# Patient Record
Sex: Male | Born: 1960
Health system: Southern US, Community
[De-identification: ages and names within clinical notes are randomized; demographics above are authoritative.]

## PROBLEM LIST (undated history)

## (undated) DIAGNOSIS — I2510 Atherosclerotic heart disease of native coronary artery without angina pectoris: Secondary | ICD-10-CM

## (undated) DIAGNOSIS — I4891 Unspecified atrial fibrillation: Secondary | ICD-10-CM

## (undated) DIAGNOSIS — G4730 Sleep apnea, unspecified: Secondary | ICD-10-CM

## (undated) DIAGNOSIS — F419 Anxiety disorder, unspecified: Secondary | ICD-10-CM

## (undated) DIAGNOSIS — C801 Malignant (primary) neoplasm, unspecified: Secondary | ICD-10-CM

## (undated) DIAGNOSIS — E785 Hyperlipidemia, unspecified: Secondary | ICD-10-CM

## (undated) DIAGNOSIS — I10 Essential (primary) hypertension: Secondary | ICD-10-CM

## (undated) HISTORY — DX: Hyperlipidemia, unspecified: E78.5

## (undated) HISTORY — PX: CARDIAC CATHETERIZATION: SHX172

## (undated) HISTORY — DX: Essential (primary) hypertension: I10

## (undated) HISTORY — PX: CHOLECYSTECTOMY: SHX55

## (undated) HISTORY — DX: Atherosclerotic heart disease of native coronary artery without angina pectoris: I25.10

## (undated) HISTORY — DX: Unspecified atrial fibrillation: I48.91

---

## 2005-03-22 ENCOUNTER — Ambulatory Visit: Payer: Self-pay | Admitting: Cardiology

## 2005-03-25 ENCOUNTER — Ambulatory Visit: Payer: Self-pay | Admitting: Cardiology

## 2005-03-27 ENCOUNTER — Ambulatory Visit: Payer: Self-pay | Admitting: Cardiology

## 2005-03-27 ENCOUNTER — Inpatient Hospital Stay (HOSPITAL_BASED_OUTPATIENT_CLINIC_OR_DEPARTMENT_OTHER): Admission: RE | Admit: 2005-03-27 | Discharge: 2005-03-27 | Payer: Self-pay | Admitting: Cardiology

## 2005-04-01 ENCOUNTER — Ambulatory Visit: Payer: Self-pay | Admitting: Cardiology

## 2008-06-24 HISTORY — PX: CORONARY ARTERY BYPASS GRAFT: SHX141

## 2008-08-05 ENCOUNTER — Encounter: Payer: Self-pay | Admitting: Cardiology

## 2008-08-08 ENCOUNTER — Ambulatory Visit: Payer: Self-pay | Admitting: Cardiology

## 2008-08-09 ENCOUNTER — Ambulatory Visit: Payer: Self-pay | Admitting: Thoracic Surgery (Cardiothoracic Vascular Surgery)

## 2008-08-09 ENCOUNTER — Inpatient Hospital Stay (HOSPITAL_COMMUNITY): Admission: AD | Admit: 2008-08-09 | Discharge: 2008-08-22 | Payer: Self-pay | Admitting: Cardiology

## 2008-08-10 ENCOUNTER — Encounter: Payer: Self-pay | Admitting: Thoracic Surgery (Cardiothoracic Vascular Surgery)

## 2008-08-16 ENCOUNTER — Encounter: Payer: Self-pay | Admitting: Thoracic Surgery (Cardiothoracic Vascular Surgery)

## 2008-08-18 ENCOUNTER — Encounter: Payer: Self-pay | Admitting: Cardiology

## 2008-08-20 ENCOUNTER — Encounter: Payer: Self-pay | Admitting: Cardiology

## 2008-08-24 ENCOUNTER — Ambulatory Visit: Payer: Self-pay | Admitting: Thoracic Surgery (Cardiothoracic Vascular Surgery)

## 2008-09-05 ENCOUNTER — Encounter: Payer: Self-pay | Admitting: Cardiology

## 2008-09-05 ENCOUNTER — Ambulatory Visit: Payer: Self-pay | Admitting: Thoracic Surgery (Cardiothoracic Vascular Surgery)

## 2008-09-05 ENCOUNTER — Encounter
Admission: RE | Admit: 2008-09-05 | Discharge: 2008-09-05 | Payer: Self-pay | Admitting: Thoracic Surgery (Cardiothoracic Vascular Surgery)

## 2008-09-06 ENCOUNTER — Ambulatory Visit: Payer: Self-pay | Admitting: Cardiology

## 2008-09-14 ENCOUNTER — Encounter: Payer: Self-pay | Admitting: Cardiology

## 2008-11-08 ENCOUNTER — Ambulatory Visit: Payer: Self-pay | Admitting: Cardiology

## 2008-12-01 ENCOUNTER — Encounter: Payer: Self-pay | Admitting: Cardiology

## 2009-02-06 DIAGNOSIS — I10 Essential (primary) hypertension: Secondary | ICD-10-CM | POA: Insufficient documentation

## 2009-02-06 DIAGNOSIS — E7800 Pure hypercholesterolemia, unspecified: Secondary | ICD-10-CM

## 2009-02-06 DIAGNOSIS — I4891 Unspecified atrial fibrillation: Secondary | ICD-10-CM | POA: Insufficient documentation

## 2009-02-06 DIAGNOSIS — I25810 Atherosclerosis of coronary artery bypass graft(s) without angina pectoris: Secondary | ICD-10-CM | POA: Insufficient documentation

## 2009-02-07 ENCOUNTER — Encounter: Payer: Self-pay | Admitting: Cardiology

## 2009-02-07 ENCOUNTER — Ambulatory Visit: Payer: Self-pay | Admitting: Cardiology

## 2009-02-07 DIAGNOSIS — F4001 Agoraphobia with panic disorder: Secondary | ICD-10-CM

## 2009-03-03 ENCOUNTER — Encounter: Payer: Self-pay | Admitting: Cardiology

## 2009-03-08 ENCOUNTER — Ambulatory Visit: Payer: Self-pay | Admitting: Cardiology

## 2009-06-07 ENCOUNTER — Telehealth (INDEPENDENT_AMBULATORY_CARE_PROVIDER_SITE_OTHER): Payer: Self-pay | Admitting: *Deleted

## 2009-09-06 ENCOUNTER — Encounter: Payer: Self-pay | Admitting: Cardiology

## 2009-09-08 ENCOUNTER — Ambulatory Visit: Payer: Self-pay | Admitting: Cardiology

## 2009-09-11 ENCOUNTER — Encounter: Payer: Self-pay | Admitting: Cardiology

## 2009-09-13 ENCOUNTER — Encounter (INDEPENDENT_AMBULATORY_CARE_PROVIDER_SITE_OTHER): Payer: Self-pay | Admitting: *Deleted

## 2009-11-28 IMAGING — CR DG CHEST 2V
2 series · 2 of 2 positions shown · non-contrast
Comparison: 08/10/2008

CLINICAL DATA: Preoperative respiratory exam for CABG.
Hypertension.  Diabetes.

CHEST - 2 VIEW

[w chest pa]
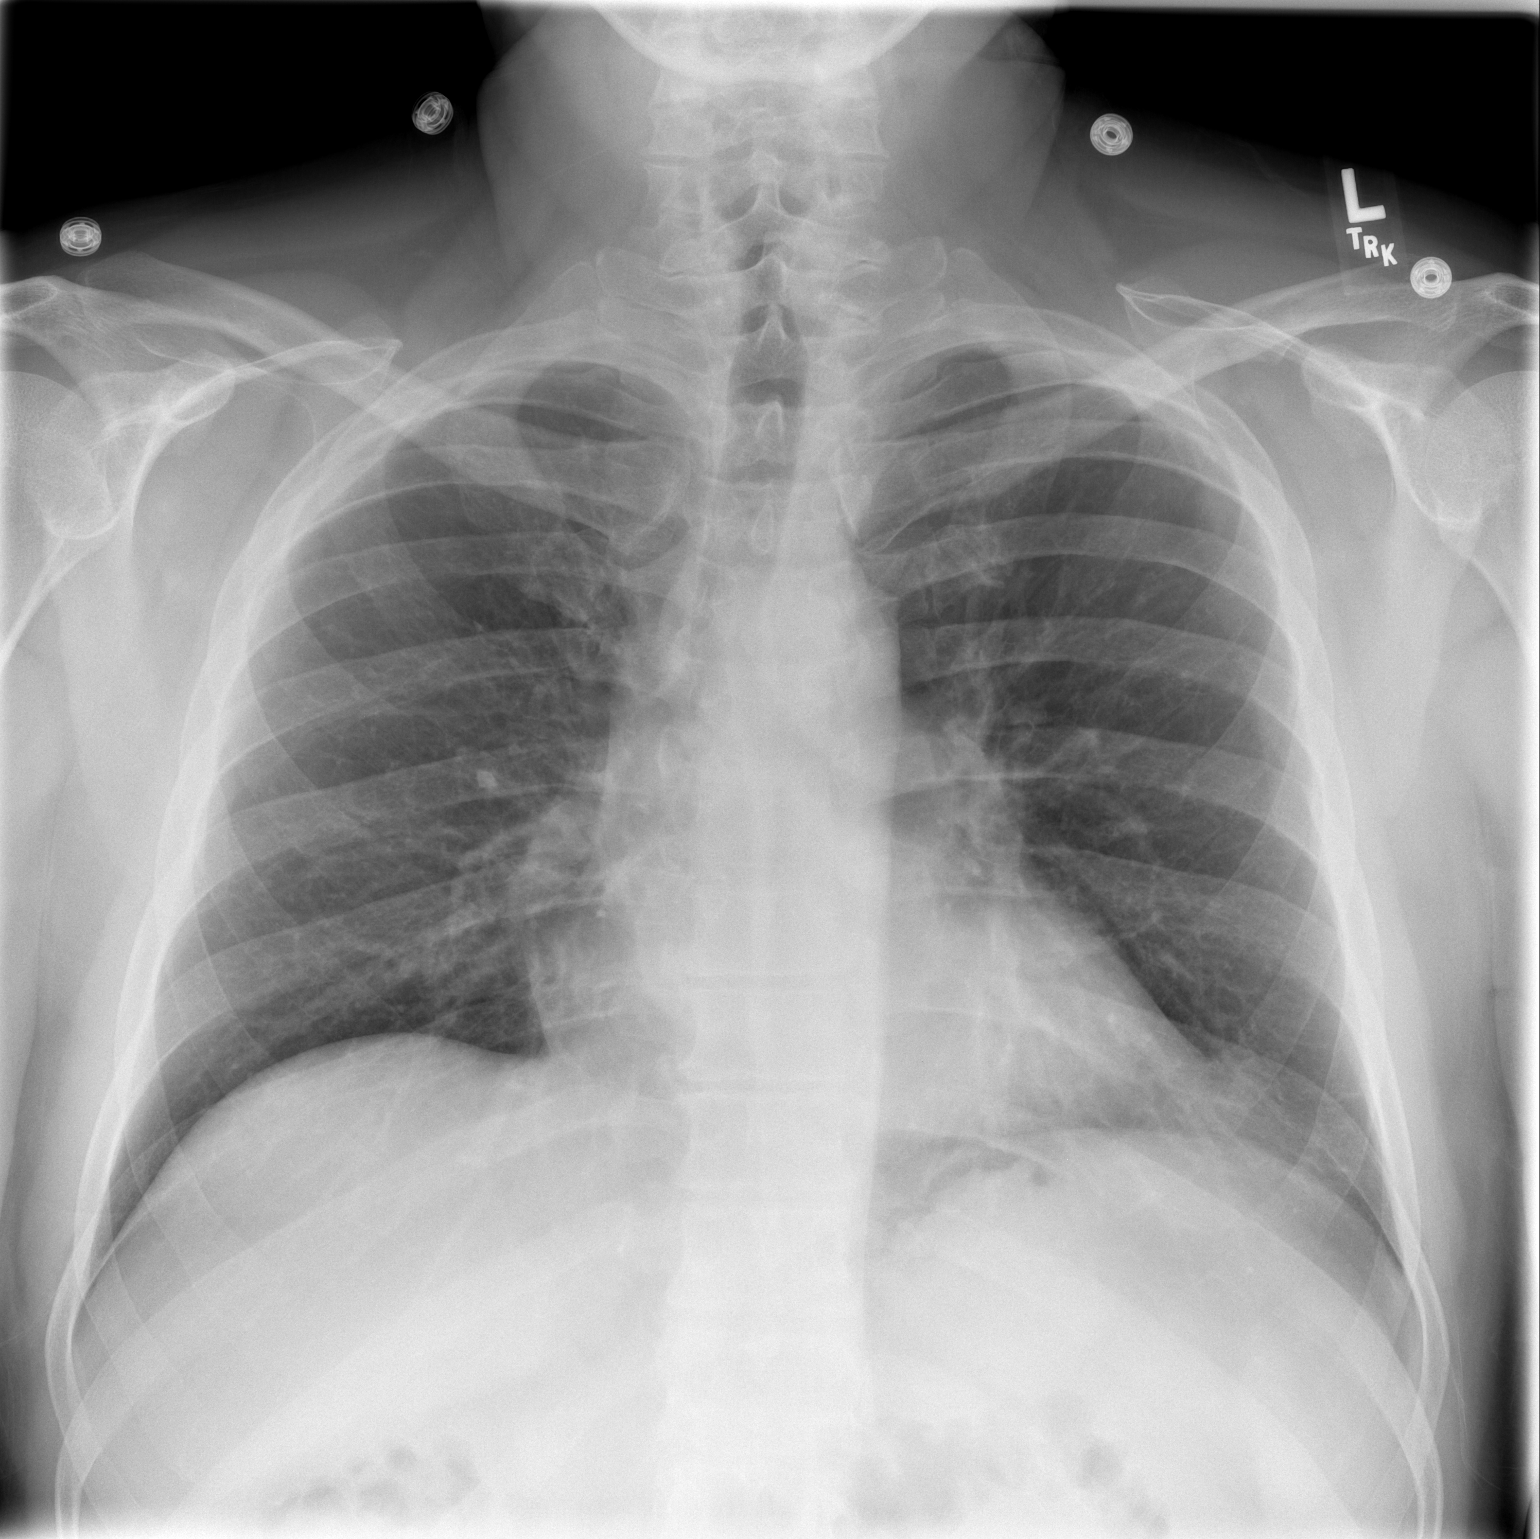

[w chest lat]
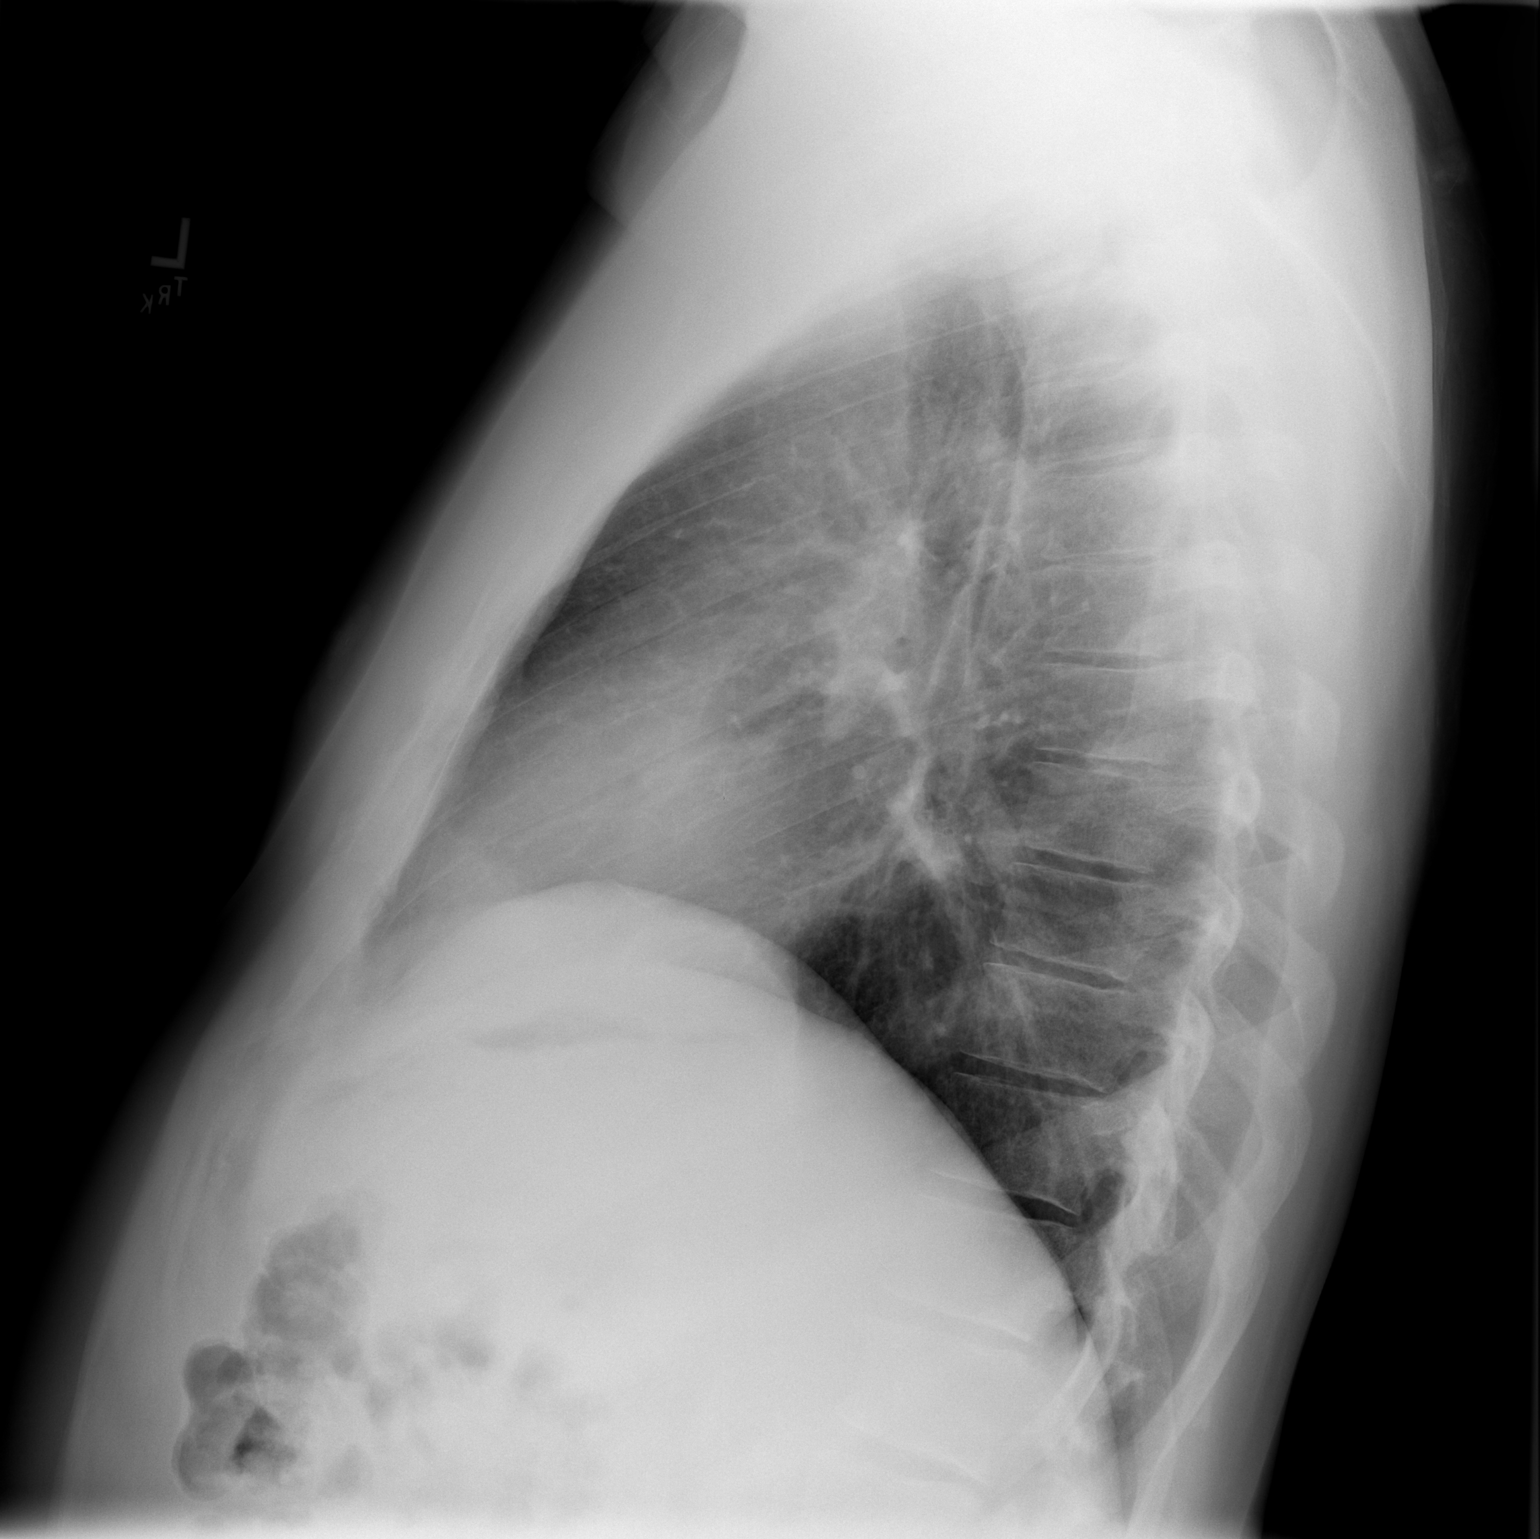

[2 of 2 positions shown; findings below may reference images not displayed]

FINDINGS: Heart size is normal.  Mediastinum is unremarkable.  The
vascularity is normal.  Lungs are clear.  No effusions.  There is
prominent epicardial fat on the left.  No significant bony finding.
IMPRESSION: Normal chest

## 2009-11-30 IMAGING — CR DG CHEST 1V PORT
1 series · 1 of 1 positions shown · non-contrast
Comparison: Portable chest [DATE]/8969 8677 hours.

CLINICAL DATA: Chest pain.  Status post CABG.

PORTABLE CHEST - 1 VIEW

[AP]
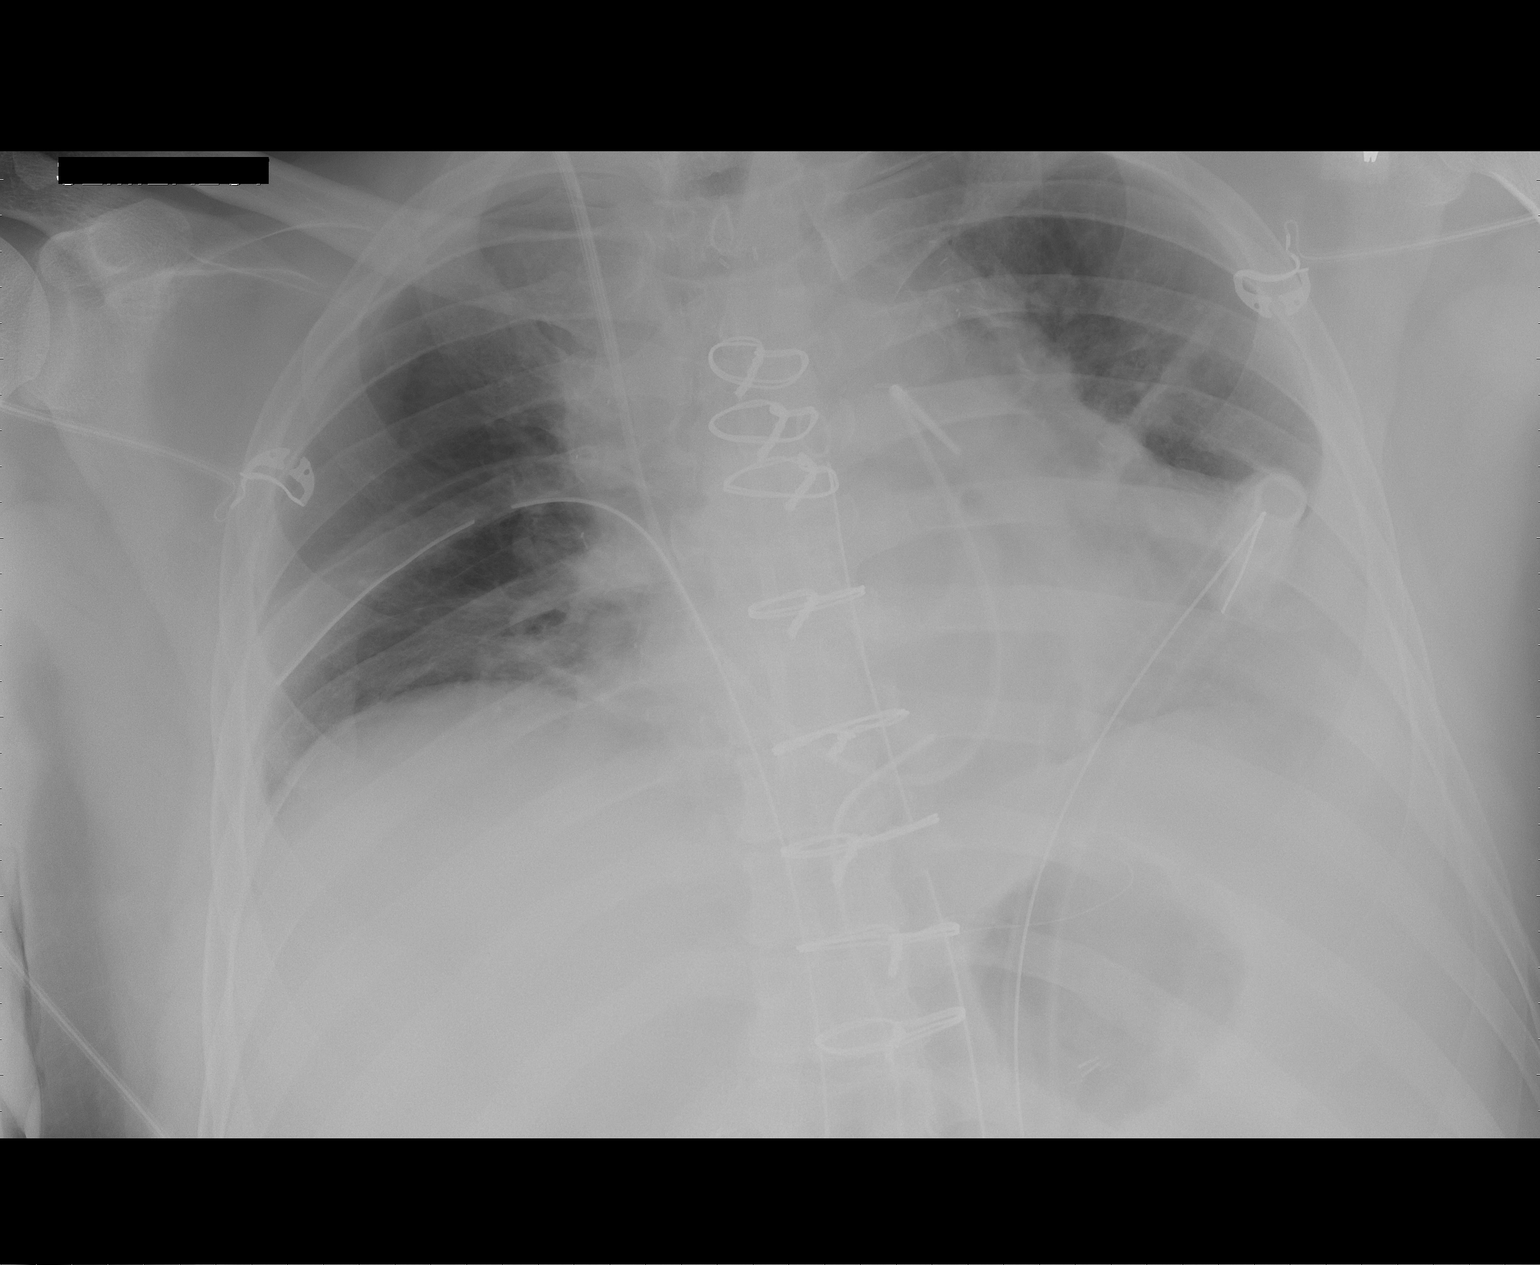

[1 of 1 positions shown; findings below may reference images not displayed]

FINDINGS: The patient's endotracheal tube and NG tube have been
removed.  Support tubes and lines including bilateral chest tubes
are otherwise unchanged. Tip of the Swan-Ganz catheter remains in
the left descending interlobar pulmonary artery.

There is no pneumothorax.  Small bilateral pleural effusions and
basilar atelectasis, greater on the left, persist.  There has been
some increase in left effusion and atelectasis.  Cardiomegaly.
IMPRESSION: 1.  Status post extubation and NG tube removal.
2.  Some increase in left basilar atelectasis and effusion.

## 2010-07-24 NOTE — Letter (Signed)
Summary: Engineer, materials at [facility]  [address]   [place], [place] [address]   Phone: [phone]  Fax: [phone]        September 13, 2009 MRN: [id number]   RALPHEAL ZAPPONE [address] [place], [place]  [address]   Dear Mr. SHOMAKER,  Your test ordered by Selena Batten has been reviewed by your physician (or physician assistant) and was found to be normal or stable. Your physician (or physician assistant) felt no changes were needed at this time.  ____ Echocardiogram  ____ Cardiac Stress Test  __X__ Lab Work  ____ Peripheral vascular study of arms, legs or neck  ____ CT scan or X-ray  ____ Lung or Breathing test  ____ Other:   Thank you.   Hoover Brunette, LPN    Duane Boston, M.D., F.A.C.C. Thressa Sheller, M.D., F.A.C.C. Oneal Grout, M.D., F.A.C.C. Cheree Ditto, M.D., F.A.C.C. Daiva Nakayama, M.D., F.A.C.C. Kenney Houseman, M.D., F.A.C.C. Jeanne Ivan, PA-C

## 2010-07-24 NOTE — Assessment & Plan Note (Signed)
Summary: 6 mo fu per feb reminder-srs   Visit Type:  Follow-up Primary Provider:  howard  CC:  follow-up visit.  History of Present Illness: the patient is a 50 year old male status post coronary bypass grafting. The patient reports no recurrent chest pain shortness of breath orthopnea PND. The patient had PTSD for a brief period of time but has discontinued citalopram and is doing much better. He denies any orthopnea PND palpitations or syncope.  Clinical Review Panels:  CXR CXR results Small (left greater than right) pleural effusions   visualized with slight ( lesser) left basilar atelectasis.  Upper   lungs are clear.  Slight cardiomegaly post CABG changes stable.  No   other new significant abnormalities seen.    IMPRESSION:    1.  Decreasing (left greater than right) small pleural effusions   with lesser left basilar atelectasis.   2.  Stable slight cardiomegaly post CABG.   3.  No new acute findings. (09/05/2008)  Cardiac Imaging Cardiac Cath Findings  1. Severe double-vessel coronary artery disease with complete       occlusion of the left anterior descending at the takeoff of a large       diagonal branch with severe stenosis of the ostium of the diagonal       branch.  Severe stenosis of the mid right coronary artery which       supplies the collaterals to the mid and distal left anterior       descending.   2. Normal left ventricular systolic function.      RECOMMENDATIONS:  This is a complex situation in a 50 year old patient   with normal LV function, hypertension and hyperlipidemia.  The right   coronary artery would be amenable to percutaneous intervention, however,   I think the patient would benefit from surgical revascularization in   regards to the complete stenosis of the mid LAD and the severe stenosis   at the ostium of the diagonal.  I will review the case with my   cardiothoracic surgery colleagues and we will consider a surgical   revascularization.   The patient was loaded with 600 mg of Plavix   yesterday and because of this there will be a delay in any surgical   revascularization.  He is currently asymptomatic without any chest pain   at rest.  The patient will be admitted to step-down intensive care unit   and started back on a heparin drip.      Verne Carrow, MD  (08/09/2008)    Preventive Screening-Counseling & Management  Alcohol-Tobacco     Smoking Status: quit     Year Quit: 1990  Current Medications (verified): 1)  Metoprolol Tartrate 25 Mg Tabs (Metoprolol Tartrate) .... Take 1 Tablet By Mouth Twice A Day 2)  Aspirin Ec 325 Mg Tbec (Aspirin) .... Take One Tablet By Mouth Daily 3)  Simvastatin 20 Mg Tabs (Simvastatin) .... Take One Tablet By Mouth Daily At Bedtime 4)  Multivitamins  Tabs (Multiple Vitamin) .... Take 1 Tablet By Mouth Once A Day  Allergies (verified): No Known Drug Allergies  Comments:  Nurse/Medical Assistant: The patient's medications and allergies were reviewed with the patient and were updated in the Medication and Allergy Lists. Bottles reviewedl  Past History:  Past Medical History: Last updated: 02/06/2009 ATRIAL FIBRILLATION (ICD-427.31) HYPERTENSION, UNSPECIFIED (ICD-401.9) HYPERCHOLESTEROLEMIA  IIA (ICD-272.0) CAD, ARTERY BYPASS GRAFT (ICD-414.04)    Past Surgical History: Last updated: 02/06/2009 CABG  Family History: Last updated: 02/06/2009 Family  History of Coronary Artery Disease:   Social History: Last updated: 02/06/2009 Full Time Married  Tobacco Use - No.  Alcohol Use - no  Risk Factors: Smoking Status: quit (09/08/2009)  Social History: Smoking Status:  quit  Review of Systems  The patient denies fatigue, malaise, fever, weight gain/loss, vision loss, decreased hearing, hoarseness, chest pain, palpitations, shortness of breath, prolonged cough, wheezing, sleep apnea, coughing up blood, abdominal pain, blood in stool, nausea, vomiting, diarrhea,  heartburn, incontinence, blood in urine, muscle weakness, joint pain, leg swelling, rash, skin lesions, headache, fainting, dizziness, depression, anxiety, enlarged lymph nodes, easy bruising or bleeding, and environmental allergies.    Vital Signs:  Patient profile:   50 year old male Height:      73 inches Weight:      224 pounds Pulse rate:   68 / minute BP sitting:   120 / 82  (left arm) Cuff size:   large  Vitals Entered By: Carlye Grippe (September 08, 2009 8:24 AM) CC: follow-up visit   Physical Exam  Additional Exam:  General: Well-developed, well-nourished in no distress head: Normocephalic and atraumatic eyes PERRLA/EOMI intact, conjunctiva and lids normal nose: No deformity or lesions mouth normal dentition, normal posterior pharynx neck: Supple, no JVD.  No masses, thyromegaly or abnormal cervical nodes lungs: Normal breath sounds bilaterally without wheezing.  Normal percussion heart: regular rate and rhythm with normal S1 and S2, no S3 or S4.  PMI is normal.  No pathological murmurs abdomen: Normal bowel sounds, abdomen is soft and nontender without masses, organomegaly or hernias noted.  No hepatosplenomegaly musculoskeletal: Back normal, normal gait muscle strength and tone normal pulsus: Pulse is normal in all 4 extremities Extremities: No peripheral pitting edema neurologic: Alert and oriented x 3 skin: Intact without lesions or rashes cervical nodes: No significant adenopathy psychologic: Normal affect    EKG  Procedure date:  09/08/2009  Findings:      sinus rhythm normal EKG heart rate 75 beats per minute.  Impression & Recommendations:  Problem # 1:  ATRIAL FIBRILLATION (ICD-427.31) resolved. EKG demonstrates normal sinus rhythm His updated medication list for this problem includes:    Metoprolol Tartrate 25 Mg Tabs (Metoprolol tartrate) .Marland Kitchen... Take 1 tablet by mouth twice a day    Aspirin Ec 325 Mg Tbec (Aspirin) .Marland Kitchen... Take one tablet by mouth  daily  Orders: EKG w/ Interpretation (93000)  Problem # 2:  HYPERCHOLESTEROLEMIA  IIA (ICD-272.0) the patient is scheduled for lipid panel and liver function tests. His updated medication list for this problem includes:    Simvastatin 20 Mg Tabs (Simvastatin) .Marland Kitchen... Take one tablet by mouth daily at bedtime  Orders: T-Lipid Profile ([address]) T-Hepatic Function [phone])  Problem # 3:  CAD, ARTERY BYPASS GRAFT (ICD-414.04) no recurrent subdural chest pain. The patient had a stress echocardiogram done in September of 2010 which was within normal limits. The patient had good exercise tolerance and was able to exercise for 13 METS His updated medication list for this problem includes:    Metoprolol Tartrate 25 Mg Tabs (Metoprolol tartrate) .Marland Kitchen... Take 1 tablet by mouth twice a day    Aspirin Ec 325 Mg Tbec (Aspirin) .Marland Kitchen... Take one tablet by mouth daily  Patient Instructions: 1)  Labs:  FLP, LFT'S  2)  Follow up in  1 year

## 2010-10-09 LAB — CBC
HCT: 30.1 % — ABNORMAL LOW (ref 39.0–52.0)
HCT: 30.4 % — ABNORMAL LOW (ref 39.0–52.0)
HCT: 44.5 % (ref 39.0–52.0)
Hemoglobin: 10.5 g/dL — ABNORMAL LOW (ref 13.0–17.0)
Hemoglobin: 10.5 g/dL — ABNORMAL LOW (ref 13.0–17.0)
Hemoglobin: 11 g/dL — ABNORMAL LOW (ref 13.0–17.0)
Hemoglobin: 11.7 g/dL — ABNORMAL LOW (ref 13.0–17.0)
Hemoglobin: 15.9 g/dL (ref 13.0–17.0)
MCHC: 35 g/dL (ref 30.0–36.0)
MCHC: 35.2 g/dL (ref 30.0–36.0)
MCHC: 35.3 g/dL (ref 30.0–36.0)
MCHC: 35.4 g/dL (ref 30.0–36.0)
MCHC: 35.4 g/dL (ref 30.0–36.0)
MCHC: 35.7 g/dL (ref 30.0–36.0)
MCHC: 35.7 g/dL (ref 30.0–36.0)
MCHC: 36.1 g/dL — ABNORMAL HIGH (ref 30.0–36.0)
MCV: 89.5 fL (ref 78.0–100.0)
MCV: 90.3 fL (ref 78.0–100.0)
MCV: 90.6 fL (ref 78.0–100.0)
MCV: 91.3 fL (ref 78.0–100.0)
MCV: 91.6 fL (ref 78.0–100.0)
MCV: 91.8 fL (ref 78.0–100.0)
MCV: 92.3 fL (ref 78.0–100.0)
Platelets: 163 10*3/uL (ref 150–400)
Platelets: 166 10*3/uL (ref 150–400)
Platelets: 169 10*3/uL (ref 150–400)
Platelets: 169 10*3/uL (ref 150–400)
Platelets: 172 10*3/uL (ref 150–400)
Platelets: 185 10*3/uL (ref 150–400)
RBC: 3.24 MIL/uL — ABNORMAL LOW (ref 4.22–5.81)
RBC: 3.4 MIL/uL — ABNORMAL LOW (ref 4.22–5.81)
RBC: 3.62 MIL/uL — ABNORMAL LOW (ref 4.22–5.81)
RBC: 4.83 MIL/uL (ref 4.22–5.81)
RBC: 4.87 MIL/uL (ref 4.22–5.81)
RDW: 12.6 % (ref 11.5–15.5)
RDW: 12.7 % (ref 11.5–15.5)
RDW: 12.8 % (ref 11.5–15.5)
RDW: 13 % (ref 11.5–15.5)
RDW: 13 % (ref 11.5–15.5)
RDW: 13.1 % (ref 11.5–15.5)
WBC: 6.2 10*3/uL (ref 4.0–10.5)
WBC: 7.1 10*3/uL (ref 4.0–10.5)
WBC: 7.5 10*3/uL (ref 4.0–10.5)
WBC: 8.3 10*3/uL (ref 4.0–10.5)
WBC: 8.8 10*3/uL (ref 4.0–10.5)

## 2010-10-09 LAB — POCT I-STAT 3, ART BLOOD GAS (G3+)
Acid-base deficit: 1 mmol/L (ref 0.0–2.0)
Acid-base deficit: 4 mmol/L — ABNORMAL HIGH (ref 0.0–2.0)
Bicarbonate: 23.7 mEq/L (ref 20.0–24.0)
O2 Saturation: 96 %
O2 Saturation: 98 %
TCO2: 21 mmol/L (ref 0–100)
TCO2: 23 mmol/L (ref 0–100)
pCO2 arterial: 35.7 mmHg (ref 35.0–45.0)
pCO2 arterial: 38.1 mmHg (ref 35.0–45.0)
pCO2 arterial: 40.7 mmHg (ref 35.0–45.0)
pH, Arterial: 7.366 (ref 7.350–7.450)
pH, Arterial: 7.374 (ref 7.350–7.450)
pO2, Arterial: 103 mmHg — ABNORMAL HIGH (ref 80.0–100.0)
pO2, Arterial: 280 mmHg — ABNORMAL HIGH (ref 80.0–100.0)
pO2, Arterial: 84 mmHg (ref 80.0–100.0)

## 2010-10-09 LAB — BASIC METABOLIC PANEL
BUN: 14 mg/dL (ref 6–23)
BUN: 15 mg/dL (ref 6–23)
BUN: 16 mg/dL (ref 6–23)
CO2: 23 mEq/L (ref 19–32)
CO2: 25 mEq/L (ref 19–32)
Calcium: 8.1 mg/dL — ABNORMAL LOW (ref 8.4–10.5)
Calcium: 9.2 mg/dL (ref 8.4–10.5)
Chloride: 110 mEq/L (ref 96–112)
Creatinine, Ser: 1.13 mg/dL (ref 0.4–1.5)
Creatinine, Ser: 1.35 mg/dL (ref 0.4–1.5)
GFR calc Af Amer: >60 mL/min (ref >60)
GFR calc Af Amer: >60 mL/min (ref >60)
GFR calc non Af Amer: 53 mL/min — ABNORMAL LOW (ref >60)
GFR calc non Af Amer: 56 mL/min — ABNORMAL LOW (ref >60)
GFR calc non Af Amer: >60 mL/min (ref >60)
GFR calc non Af Amer: >60 mL/min (ref >60)
Glucose, Bld: 111 mg/dL — ABNORMAL HIGH (ref 70–99)
Glucose, Bld: 122 mg/dL — ABNORMAL HIGH (ref 70–99)
Glucose, Bld: 95 mg/dL (ref 70–99)
Potassium: 3.7 mEq/L (ref 3.5–5.1)
Potassium: 4.3 mEq/L (ref 3.5–5.1)
Sodium: 133 mEq/L — ABNORMAL LOW (ref 135–145)
Sodium: 136 mEq/L (ref 135–145)
Sodium: 136 mEq/L (ref 135–145)
Sodium: 137 mEq/L (ref 135–145)

## 2010-10-09 LAB — HEPARIN LEVEL (UNFRACTIONATED)
Heparin Unfractionated: 0.27 IU/mL — ABNORMAL LOW (ref 0.30–0.70)
Heparin Unfractionated: 0.39 IU/mL (ref 0.30–0.70)
Heparin Unfractionated: 0.44 IU/mL (ref 0.30–0.70)
Heparin Unfractionated: 0.47 IU/mL (ref 0.30–0.70)
Heparin Unfractionated: 0.49 IU/mL (ref 0.30–0.70)

## 2010-10-09 LAB — GLUCOSE, CAPILLARY
Glucose-Capillary: 107 mg/dL — ABNORMAL HIGH (ref 70–99)
Glucose-Capillary: 109 mg/dL — ABNORMAL HIGH (ref 70–99)
Glucose-Capillary: 111 mg/dL — ABNORMAL HIGH (ref 70–99)
Glucose-Capillary: 111 mg/dL — ABNORMAL HIGH (ref 70–99)
Glucose-Capillary: 125 mg/dL — ABNORMAL HIGH (ref 70–99)
Glucose-Capillary: 57 mg/dL — ABNORMAL LOW (ref 70–99)
Glucose-Capillary: 94 mg/dL (ref 70–99)
Glucose-Capillary: 96 mg/dL (ref 70–99)

## 2010-10-09 LAB — CROSSMATCH
ABO/RH(D): A POS
Antibody Screen: NEGATIVE

## 2010-10-09 LAB — COMPREHENSIVE METABOLIC PANEL
ALT: 27 U/L (ref 0–53)
Albumin: 3.5 g/dL (ref 3.5–5.2)
Alkaline Phosphatase: 93 U/L (ref 39–117)
BUN: 11 mg/dL (ref 6–23)
CO2: 23 mEq/L (ref 19–32)
Calcium: 8.8 mg/dL (ref 8.4–10.5)
Chloride: 105 mEq/L (ref 96–112)
Chloride: 106 mEq/L (ref 96–112)
Creatinine, Ser: 1.08 mg/dL (ref 0.4–1.5)
GFR calc non Af Amer: >60 mL/min (ref >60)
Glucose, Bld: 90 mg/dL (ref 70–99)
Potassium: 4.2 mEq/L (ref 3.5–5.1)
Sodium: 136 mEq/L (ref 135–145)
Total Bilirubin: 1.2 mg/dL (ref 0.3–1.2)
Total Protein: 6.2 g/dL (ref 6.0–8.3)

## 2010-10-09 LAB — POCT I-STAT 4, (NA,K, GLUC, HGB,HCT)
Glucose, Bld: 104 mg/dL — ABNORMAL HIGH (ref 70–99)
Glucose, Bld: 108 mg/dL — ABNORMAL HIGH (ref 70–99)
Glucose, Bld: 109 mg/dL — ABNORMAL HIGH (ref 70–99)
Glucose, Bld: 91 mg/dL (ref 70–99)
Glucose, Bld: 98 mg/dL (ref 70–99)
HCT: 26 % — ABNORMAL LOW (ref 39.0–52.0)
HCT: 29 % — ABNORMAL LOW (ref 39.0–52.0)
HCT: 31 % — ABNORMAL LOW (ref 39.0–52.0)
Hemoglobin: 10.5 g/dL — ABNORMAL LOW (ref 13.0–17.0)
Hemoglobin: 8.8 g/dL — ABNORMAL LOW (ref 13.0–17.0)
Potassium: 3.9 mEq/L (ref 3.5–5.1)
Potassium: 4.1 mEq/L (ref 3.5–5.1)
Potassium: 4.3 mEq/L (ref 3.5–5.1)
Potassium: 4.7 mEq/L (ref 3.5–5.1)
Sodium: 135 mEq/L (ref 135–145)
Sodium: 138 mEq/L (ref 135–145)
Sodium: 138 mEq/L (ref 135–145)

## 2010-10-09 LAB — POCT I-STAT, CHEM 8
BUN: 12 mg/dL (ref 6–23)
Calcium, Ion: 1.2 mmol/L (ref 1.12–1.32)
Creatinine, Ser: 1.1 mg/dL (ref 0.4–1.5)
Glucose, Bld: 126 mg/dL — ABNORMAL HIGH (ref 70–99)
TCO2: 22 mmol/L (ref 0–100)

## 2010-10-09 LAB — LIPID PANEL
LDL Cholesterol: 67 mg/dL (ref 0–99)
Total CHOL/HDL Ratio: 5.9 RATIO
Triglycerides: 176 mg/dL — ABNORMAL HIGH (ref <150)
VLDL: 35 mg/dL (ref 0–40)

## 2010-10-09 LAB — HEMOGLOBIN AND HEMATOCRIT, BLOOD
HCT: 31.6 % — ABNORMAL LOW (ref 39.0–52.0)
Hemoglobin: 11.1 g/dL — ABNORMAL LOW (ref 13.0–17.0)

## 2010-10-09 LAB — POCT I-STAT GLUCOSE: Glucose, Bld: 104 mg/dL — ABNORMAL HIGH (ref 70–99)

## 2010-10-09 LAB — CARDIAC PANEL(CRET KIN+CKTOT+MB+TROPI)
CK, MB: 0.7 ng/mL (ref 0.3–4.0)
CK, MB: 0.7 ng/mL (ref 0.3–4.0)
Relative Index: INVALID (ref 0.0–2.5)
Relative Index: INVALID (ref 0.0–2.5)
Relative Index: INVALID (ref 0.0–2.5)
Total CK: 47 U/L (ref 7–232)
Total CK: 57 U/L (ref 7–232)
Troponin I: <0.01 ng/mL (ref 0.00–0.06)
Troponin I: <0.01 ng/mL (ref 0.00–0.06)
Troponin I: <0.01 ng/mL (ref 0.00–0.06)
Troponin I: <0.01 ng/mL (ref 0.00–0.06)

## 2010-10-09 LAB — PROTIME-INR
INR: 1.1 (ref 0.00–1.49)
Prothrombin Time: 14.3 seconds (ref 11.6–15.2)
Prothrombin Time: 19.6 seconds — ABNORMAL HIGH (ref 11.6–15.2)

## 2010-10-09 LAB — BLOOD GAS, ARTERIAL
Acid-base deficit: 0.2 mmol/L (ref 0.0–2.0)
TCO2: 25.1 mmol/L (ref 0–100)
pCO2 arterial: 38.7 mmHg (ref 35.0–45.0)
pO2, Arterial: 68.4 mmHg — ABNORMAL LOW (ref 80.0–100.0)

## 2010-10-09 LAB — HEMOGLOBIN A1C: Hgb A1c MFr Bld: 5.3 % (ref 4.6–6.1)

## 2010-11-06 NOTE — Assessment & Plan Note (Signed)
[facility]                          [facility] OFFICE NOTE   NAME:Robert Mack, Robert Mack                        MRN:          [id number]  DATE:09/06/2008                            DOB:          1961/01/03    Robert Mack is seen today following his cardiac surgery.  The patient was  seen at [facility] and then transferred to [facility] for cardiac  catheterization.  When he presented to [facility], we knew he had a strong  family history of coronary artery disease.  He had a stress test and it  was markedly positive and he was referred immediately for  catheterization.  The cath was done on August 09, 2008.  Decision was  then made to proceed with CABG.  This was done by Dr. Tressie Stalker.  The patient received CABG x3 with a LIMA to the LAD, RIMA to the distal  right coronary artery, and SVG to the first diagonal.  He did very well.  Postoperatively, I believe that he had atrial fibrillation.  I cannot  document that currently in the discharge summary.  However, he was sent  home on amiodarone and it can now be stopped today.  He is feeling well  in general and going about full activities.  He saw Dr. Cornelius Moras yesterday.   ALLERGIES:  No known drug allergies.   MEDICATIONS:  Aspirin, amiodarone (to be stopped today), simvastatin,  and metoprolol.   OTHER MEDICAL PROBLEMS:  See the list below.   REVIEW OF SYSTEMS:  He has no fevers or chills.  He has no GI or GU  symptoms.  He has not had any skin rashes.  His chest is healing nicely.  He has no headache or eye problems.  His review of systems otherwise is  negative.   PHYSICAL EXAMINATION:  VITAL SIGNS:  Blood pressure is 116/73 with a  pulse of 63.  Weight is 228 pounds.  GENERAL:  The patient is oriented to person, time, and place.  Affect is  normal.  The patient is here with his wife today.  HEENT:  No xanthelasma.  He has normal extraocular motion.  NECK:  There are no carotid bruits.  There is no  jugular venous  distention.  LUNGS:  Clear.  Respiratory effort is not labored.  CARDIAC:  S1 and S2.  There are no clicks or significant murmurs.  His  rhythm is quite regular.  ABDOMEN:  Soft.  EXTREMITIES:  He has no peripheral edema.   PROBLEMS:  1. Hypertension.  2. Hypercholesterolemia.  3. Coronary artery disease.  4. Status post coronary artery bypass graft on August 16, 2008, by      Dr. Cornelius Moras.  5. Postoperative arrhythmia, treated with amiodarone, it can now be      stopped.   We will stop his amiodarone.  He is to remain out of work until at least  late May 2010.  I will see him for followup.     Luis Abed, MD, [facility]  Electronically Signed    JDK/MedQ  DD: 09/06/2008  DT: 09/07/2008  Job #:  [id number] 

## 2010-11-06 NOTE — Cardiovascular Report (Signed)
Robert Mack, Robert Mack                 ACCOUNT NO.:  [id number]   MEDICAL RECORD NO.:  [id number]          PATIENT TYPE:  INP   LOCATION:  2920                         FACILITY:  [facility]   PHYSICIAN:  Verne Carrow, MDDATE OF BIRTH:  01-Oct-1960   DATE OF PROCEDURE:  08/09/2008  DATE OF DISCHARGE:                            CARDIAC CATHETERIZATION   PRIMARY CARDIOLOGIST:  Learta Codding, MD, [facility]   PROCEDURE PERFORMED:  1. Left heart catheterization.  2. Selective coronary angiography.  3. Left ventricular angiogram.  4. Placement of an Angio-Seal femoral artery closure device.   OPERATOR:  Verne Carrow, MD   INDICATION:  Chest pain in a 50 year old male with a history of  hypertension, hyperlipidemia, and a mild nonobstructive coronary artery  disease by cath in 2006 who has been having chest pain for the last 6-7  weeks associated with shortness of breath and minimal exertion.  The  patient had an outpatient stress Myoview performed at [facility]  yesterday and had significant chest pain with EKG changes during  exercise.  The test was stopped.  His stress images were significant for  significant anteroapical and inferoapical ischemia.  The patient was  transported to [facility] today for left heart catheterization.   DETAILS OF PROCEDURE:  The patient was brought to the Inpatient Cardiac  Catheterization Laboratory after signing informed consent for the  procedure.  The right groin was prepped and draped in a sterile fashion.  A 6-French sheath was inserted into the right femoral artery without  difficulty.  Standard diagnostic catheters were used to perform  selective coronary angiography.  A 6-French pigtail catheter was used to  cross the aortic valve into the left ventricle.  Following the  performance of a left ventricular angiogram, the catheter was pulled  back across the aortic valve with no significant pressure gradient  measured.  The patient  tolerated the procedure well and was taken to the  holding area in stable condition.  An Angio-Seal femoral artery closure  device was used to close the right femoral artery access site.   ANGIOGRAPHIC FINDINGS:  1. The left main coronary artery bifurcates into the circumflex, a      moderate size ramus intermedius branch and the LAD.  There is a 20%      stenosis in the midportion of the left main artery.  2. The left anterior descending has a 30% stenosis in the proximal      portion and a 100% occlusion in the midportion of the vessel at the      trifurcation of a large diagonal branch and a septal perforator.      The mid and distal LAD fill from right collaterals.  The first      diagonal has a 99% ostial stenosis.  3. The circumflex artery has no significant disease.  There is a      moderate size ramus intermedius branch that has no significant      disease.  4. The right coronary artery is a large dominant vessel that has a 95-      99%  stenosis in the midportion of the vessel.  There are luminal      irregularities in the distal right coronary artery and the      posterolateral branch.  The posterior descending artery is without      any significant disease.  The posterior descending artery supplies      collaterals to the mid and distal LAD.  5. Left ventricular angiogram was obtained in the RAO projection.      There was normal systolic function of the left ventricle with no      significant wall motion abnormalities.  The ejection fraction was      estimated at 55%.   HEMODYNAMIC DATA:  Central aortic pressure 137/83, left ventricular  pressure 132/2, end-diastolic pressure 10.   IMPRESSION:  1. Severe double-vessel coronary artery disease with complete      occlusion of the left anterior descending at the takeoff of a large      diagonal branch with severe stenosis of the ostium of the diagonal      branch.  Severe stenosis of the mid right coronary artery which       supplies the collaterals to the mid and distal left anterior      descending.  2. Normal left ventricular systolic function.   RECOMMENDATIONS:  This is a complex situation in a 50 year old patient  with normal LV function, hypertension and hyperlipidemia.  The right  coronary artery would be amenable to percutaneous intervention, however,  I think the patient would benefit from surgical revascularization in  regards to the complete stenosis of the mid LAD and the severe stenosis  at the ostium of the diagonal.  I will review the case with my  cardiothoracic surgery colleagues and we will consider a surgical  revascularization.  The patient was loaded with 600 mg of Plavix  yesterday and because of this there will be a delay in any surgical  revascularization.  He is currently asymptomatic without any chest pain  at rest.  The patient will be admitted to step-down intensive care unit  and started back on a heparin drip.      Verne Carrow, MD  Electronically Signed     CM/MEDQ  D:  08/09/2008  T:  08/10/2008  Job:  [id number]

## 2010-11-06 NOTE — Assessment & Plan Note (Signed)
OFFICE VISIT   Robert Mack, Robert Mack  DOB:  06/09/1961                                        September 05, 2008  CHART #:  [id number]   The patient returns to the office today for routine followup, status  post coronary artery bypass grafting x3 using bilateral internal mammary  arteries on August 16, 2008.  The patient's postoperative recovery has  been uneventful.  Following hospital discharge, he has continued to  improve.  Overall, he is getting along quite well.  He has not yet been  seen in followup by Dr. Myrtis Ser, but he is scheduled to see him tomorrow.  The patient has very mild residual soreness in his chest and at this  point he is only taking pain relievers at night to help him sleep.  His  activity level is quite good.  He has had no shortness of breath.  He  has no fevers or chills.  His appetite is good.  He has no complaints  and is overall getting along fine.  Medications remain unchanged from  the time of hospital discharge.   PHYSICAL EXAMINATION:  GENERAL:  A well-appearing male.  VITAL SIGNS:  Blood pressure 123/83, pulse 63, and oxygen saturation 98%  on room air.  CHEST:  A median sternotomy incision that is healing nicely.  The  sternum is stable on palpation.  LUNGS:  Breath sounds are clear to auscultation and symmetrical  bilaterally.  CARDIOVASCULAR:  Regular rate and rhythm.  No murmurs, rubs, or gallops  noted.  ABDOMEN:  Soft and nontender.  EXTREMITIES:  Warm and well perfused.  The small incision just below the  right knee from endoscopic vein harvest is also healing well.   DIAGNOSTIC TEST:  Chest x-ray obtained today at the [facility] is reviewed.  This demonstrates clear lung fields bilaterally  with trivial pleural effusions.  All the sternal wires appear intact.  No other abnormalities are noted.   IMPRESSION:  The patient is doing very well.   PLAN:  I have encouraged the patient to continue to increase  his  physical activity as tolerated with his only limitation at this point  remaining that he refrain from heavy lifting or strenuous use of his  arms or shoulders for at least another 2 months.  I have encouraged him  to get started in the cardiac rehab program.  I think he can go ahead  and resume driving an automobile at this point in time.  We have not  made any changes in his current medications.  In the future, he will  call or return to see [place] here at [facility]  only should further problems or difficulties arise.   Salvatore Decent. Cornelius Moras, M.D.  Electronically Signed   CHO/MEDQ  D:  09/05/2008  T:  09/06/2008  Job:  [id number]   cc:   Luis Abed, MD, [facility]  Verne Carrow, MD  Lorie Phenix, MD,FACC

## 2010-11-06 NOTE — Consult Note (Signed)
NAMEERSEL, ENSLIN                 ACCOUNT NO.:  [id number]   MEDICAL RECORD NO.:  [id number]          PATIENT TYPE:  INP   LOCATION:  2920                         FACILITY:  [facility]   PHYSICIAN:  Salvatore Decent. Cornelius Moras, M.D. DATE OF BIRTH:  October 12, 1960   DATE OF CONSULTATION:  08/09/2008  DATE OF DISCHARGE:                                 CONSULTATION   REQUESTING PHYSICIAN:  Verne Carrow, MD   REASON FOR CONSULTATION:  Severe 2-vessel coronary artery disease.   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  The patient states that approximately  6 weeks ago he first developed episodes of exertional substernal chest  pain consistent with angina pectoris.  He would notice that he was  walking to work on a cold morning.  He would feel substernal chest pain  described as a burning pain associated with shortness of breath.  Once  he would get to his desk and sit down to work, symptoms would resolve  within a few minutes.  He has been having episodes of similar pain  nearly every day since then.  He reports no episodes of prolonged chest  pain and his pain has always subsided within a few minutes of rest.  He  denies any episodes of pain occurring at rest or at night awaking him  from the sleep.  He has had some moderate exertional shortness of breath  that has developed over this period of time, and does not necessarily  have been associated with chest pain.  He denies resting shortness of  breath, PND, orthopnea, or lower extremity edema.  He underwent an  exercise treadmill test that was early positive.  He was admitted to  [facility] and  loaded with oral Plavix.  He was transferred to  [facility] where he underwent cardiac catheterization  this afternoon by Dr.  Clifton James.  He was found to have severe 2-vessel  coronary artery disease with preserved left ventricular function and  coronary anatomy unfavorable for percutaneous coronary intervention.  Cardiothoracic surgical consultation was requested.   REVIEW OF SYSTEMS:  GENERAL:  The patient reports normal appetite.  He  has not been gaining nor losing weight recently.  Energy level is  decreased over the last several weeks.  CARDIAC:  Notable for  accelerating symptoms of exertional chest pain consistent with angina  pectoris.  The patient reports moderate exertional shortness of breath.  RESPIRATORY:  Notable for a persistent dry nonproductive cough that is  associated with symptoms of shortness of breath and pain.  He,  otherwise, denies productive cough, hemoptysis, wheezing.  GASTROINTESTINAL:  Negative.  The patient has no difficulty swallowing.  He denies hematochezia, hematemesis, melena.  MUSCULOSKELETAL:  Negative.  The patient denies significant arthritis or arthralgias.  NEUROLOGIC:  Negative.  The patient denies symptoms suggestive of TIA or  stroke.  GENITOURINARY:  Negative.  HEENT:  Negative.  PAST MEDICAL HISTORY:  1. Hypertension.  2. Hyperlipidemia.   PAST SURGICAL HISTORY:  None.   FAMILY HISTORY:  Notable in that the patient's brother underwent  coronary artery bypass surgery in his 27s.   SOCIAL HISTORY:  The patient is married and lives with his wife.  They  have 20 year old daughter.  They live in [place].  He works for [employer] of  [place] here in [place].  He is a nonsmoker.  He denies significant  alcohol consumption.   MEDICATIONS PRIOR TO ADMISSION:  1. Toprol-XL 25 mg daily.  2. Zocor 40 mg daily.  3. Aspirin 325 mg daily.   DRUG ALLERGIES:  None known.   PHYSICAL EXAMINATION:  GENERAL:  The patient is a well-appearing male  who appears his stated age, in no acute distress.  HEENT:  Unrevealing.  NECK:  Supple.  There is no cervical or supraclavicular  lymphadenopathy.  There is no jugular venous distention.  There are no carotid bruits.  LUNGS:  Auscultation of the chest reveals clear breath sounds which are  symmetrical bilaterally.  No wheezes or rhonchi noted.  CARDIOVASCULAR:  Regular rate and rhythm.  No murmurs, rubs, or gallops  noted.  ABDOMEN:  Mildly obese but soft, nontender.  There are no palpable  masses.  Bowel sounds are present.  EXTREMITIES:  Warm and well perfused.  There is no lower extremity  edema.  Distal pulses are palpable in the posterior tibial position.  There is no sign of significant venous insufficiency.  SKIN:  Clean, dry, and healthy appearing throughout.  RECTAL AND GU:  Both deferred.   DIAGNOSTIC TEST:  Cardiac catheterization performed by Dr. Clifton James is  reviewed.  This demonstrates severe 2-vessel coronary artery disease  with preserved left ventricular function.  Specifically, there is a 100%  chronic occlusion of the mid left anterior descending coronary artery  just after takeoff of large diagonal branch.  There is subtotal  occlusion of the diagonal branch.  The left circumflex is free of  significant disease.  There is right dominant coronary circulation.  There is 95-97% stenosis of the mid right coronary artery.  There is  right-to-left collateral filling of the distal left anterior descending  coronary artery.  Left ventricular function appears preserved with  ejection fraction estimated 55%.   IMPRESSION:  Severe 2-vessel coronary artery disease with coronary  anatomy unfavorable for percutaneous coronary intervention.  Options  include coronary artery bypass grafting, possibly with bilateral  internal mammary arteries versus possibly hybrid approach with  percutaneous coronary intervention and stenting of the right coronary  artery and surgical revascularization of the left anterior descending  coronary artery and the diagonal branch.  However, I suspect that  multivessel arterial  grafting with coronary artery bypass grafting for  surgical revascularization may represent the best long-term option.   PLAN:  I have discussed matters at length with Mr. Enderson and his wife  this evening.  Alternative treatment strategies have been discussed.  They understand and accept all associated risks of surgery including but  not limited to risk of death, stroke, myocardial infarction, congestive  heart failure, respiratory failure, pneumonia, bleeding requiring blood  transfusion, arrhythmia, infection, and recurrent coronary artery  disease.  He received a loading dose  of Plavix yesterday and another dose this morning.  He remains entirely  clinically stable, and as such I suspect he would best be treated with  delayed surgical revascularization after allowing the effects of Plavix  to dissipate from his  system.      Salvatore Decent. Cornelius Moras, M.D.  Electronically Signed     CHO/MEDQ  D:  08/09/2008  T:  08/10/2008  Job:  [id number]   cc:   Verne Carrow, MD  Luis Abed, MD, [facility]  Learta Codding, MD,FACC  Selinda Flavin

## 2010-11-06 NOTE — Op Note (Signed)
NAME:  Robert Mack, Robert Mack                 ACCOUNT NO.:  [id number]   MEDICAL RECORD NO.:  [id number]          PATIENT TYPE:  INP   LOCATION:  2313                         FACILITY:  [facility]   PHYSICIAN:  Salvatore Decent. Cornelius Moras, M.D. DATE OF BIRTH:  1961-05-05   DATE OF PROCEDURE:  DATE OF DISCHARGE:                               OPERATIVE REPORT   PREOPERATIVE DIAGNOSIS:  Severe two-vessel coronary artery disease.   POSTOPERATIVE DIAGNOSIS:  Severe two-vessel coronary artery disease.   PROCEDURE:  Median sternotomy for coronary artery bypass grafting x3  (left internal mammary artery to distal left anterior descending  coronary artery, right internal mammary artery to distal right coronary  artery, saphenous vein graft to first diagonal branch, endoscopic  saphenous vein harvest from right thigh).   SURGEON:  Salvatore Decent. Cornelius Moras, MD   ASSISTANT:  Doree Fudge, PA   ANESTHESIA:  General.   BRIEF CLINICAL NOTE:  The patient is a 50 year old male who presents  with new onset angina pectoris.  He underwent a stress test that was  early positive prompting hospital admission at [facility] in  [place].  He was transferred to [facility] where cardiac  catheterization was performed.  This revealed severe two-vessel coronary  artery disease with normal left ventricular function and coronary  anatomy not favorable for percutaneous coronary intervention.  A full  consultation note has been dictated previously.   OPERATIVE FINDINGS:  1. Normal left ventricular function.  2. Good quality left and right internal mammary artery conduits for      grafting.  3. Good quality saphenous vein conduit for grafting.  4. Good quality target vessels for grafting.   OPERATIVE NOTE DETAIL:  The patient was brought to the operating room on  the above-mentioned date.  The central monitoring was established by the  Anesthesia Team under the care and direction of Dr. Adonis Huguenin.  Specifically, a Swan-Ganz catheter was placed through the right internal  jugular approach.  A radial arterial line was placed.  Intravenous  antibiotics were administered.  Following induction with general  endotracheal anesthesia, a Foley catheter was placed.  The patient's  chest, abdomen, both groins, and both lower extremities are prepared and  draped in sterile manner.   Baseline transesophageal echocardiogram was performed by Dr. Krista Blue.  This demonstrates normal left ventricular function.  No other  abnormalities are noted.   A median sternotomy incision was performed.  The left internal mammary  artery is dissected from the chest wall and prepared for bypass  grafting.  Following this, the right internal mammary artery was also  dissected from the chest wall and prepared for bypass grafting.  Both  internal mammary arteries are of good quality conduit.  Simultaneously,  saphenous vein was obtained from the patient's right thigh using  endoscopic vein harvest technique.  The saphenous vein was good quality  conduit.  After the saphenous vein has been removed, the small incision  in the right lower extremity was closed in multiple layers with running  absorbable suture.  The patient was heparinized systemically.  Both  internal  mammary arteries were transected distally and noted to have  excellent flow.   The pericardium was opened.  The ascending aorta is normal in  appearance.  The ascending aorta and the right atrium are cannulated for  cardiopulmonary bypass.  A retrograde cardioplegic catheter was placed  through the right atrium into the coronary sinus.  Cardiopulmonary  bypass was begun.  A temperature probe was placed in left ventricular  septum and a cardioplegic cannula was placed in the ascending aorta.   The patient was allowed to cool passively to 32 degrees systemic  temperature.  The aortic cross-clamp was applied and cold blood  cardioplegia was delivered  initially in an antegrade fashion through the  aortic root.  Supplemental cardioplegia was administered retrograde  through the coronary sinus catheter.  Iced saline solution was applied  for topical hypothermia.  The initial cardioplegic arrest was rapid and  excellent.  Repeat doses of cardioplegia are administered intermittently  throughout the cross-clamp portion of the operation through the aortic  root, down the subsequently placed vein graft, and retrograde through  the coronary sinus catheter to maintain left ventricular septal  temperature below 15 degrees centigrade and completely isoelectric  electrocardiogram.   The following distal coronary anastomoses were performed:  1. The distal right coronary artery is grafted with a right internal      mammary artery in an end-to-side fashion.  This vessel measured 2.2      mm in diameter and is good quality target vessel for grafting.  2. The first diagonal branch of the left anterior descending coronary      artery is grafted with a saphenous vein graft in an end-to-side      fashion.  This vessel measures 1.7 mm in diameter and is a good-      quality target vessel for grafting.  3. The distal left anterior descending coronary artery is grafted with      left internal mammary artery in an end-to-side fashion.  This      vessel measured 2.0 mm in diameter and is a good-quality target      vessel for grafting.   The single proximal saphenous vein anastomoses is performed directly to  the ascending aorta prior to removal of the aortic cross-clamp.  The  left ventricular septal temperature rises rapidly with reperfusion of  the internal mammary artery grafts.  The aortic cross-clamp was removed  after total cross-clamp time was 73 minutes.  The heart begins to beat  spontaneously without need for cardioversion.  The proximal and all  three distal coronary anastomoses are inspected for hemostasis and  appropriate graft orientation.   Epicardial pacing wires were fixed to  the right ventricular free wall into the right atrial appendage.  The  patient was rewarmed to 37 degrees centigrade temperature.  The patient  was weaned from cardiopulmonary bypass without difficulty.  The  patient's rhythm at separation from bypass is normal sinus rhythm.  No  inotropic support is required.  Total cardiopulmonary bypass time of the  operation is 99 minutes.  Followup transesophageal echocardiogram  performed by Dr. Ivin Booty after separation from bypass demonstrates normal  left ventricular function.   The venous and arterial cannulae are removed uneventfully.  Protamine  was administered to reverse the anticoagulation.  The mediastinum and  both left and right pleural spaces were irrigated with saline solution.  Meticulous surgical hemostasis was ascertained.  The mediastinum and  both left and right pleural spaces were drained with  four chest tubes,  exited through separate stab incisions inferiorly.  The pericardium and  soft tissues anterior to the aorta are reapproximated loosely.  The  sternum was closed with double-strength sternal wire.  The soft tissues  anterior to the sternum are closed in multiple layers and the skin was  closed with running subcuticular skin closure.   The patient tolerated the procedure well and he was transported to the  surgical intensive care unit in stable condition.  There are no  intraoperative complications.  All sponge, instrument, and needle counts  were verified correct at completion of the operation.  No blood products  were administered.      Salvatore Decent. Cornelius Moras, M.D.  Electronically Signed     CHO/MEDQ  D:  08/16/2008  T:  08/17/2008  Job:  [id number]   cc:   Luis Abed, MD, [facility]  Verne Carrow, MD  Learta Codding, MD,FACC  Selinda Flavin

## 2010-11-06 NOTE — Assessment & Plan Note (Signed)
[facility]                          [facility] OFF[facility] NOTE   NAME:Robert Mack, Robert Mack                        MRN:          [id number]  DATE:11/08/2008                            DOB:          11-14-1960    REFERRING PHYSICIAN:  Selinda Mack   HISTORY OF PRESENT ILLNESS:  The patient is a 50 year old male husband  of Robert Mack.  The patient underwent a recent cardiac catheterization  was found to have significant coronary artery disease and underwent  coronary artery bypass grafting with a LIMA and a RIMA graft.  The  patient has done extremely well.  He has had no shortness of breath or  chest pain.  He participated in cardiac rehab.  He has lost 30 pounds.  In the meanwhile, he has resumed his usual physical activity.  He still  states that at times he feels somewhat tired.  His main complaint  actually is difficulty sleeping since his surgery.  He denies any  symptoms consistent with anxiety or depression.   MEDICATIONS:  1. Enteric-coated aspirin 325 mg p.o. daily.  2. Simvastatin 20 mg p.o. daily.  3. Toprol 25 mg p.o. b.i.d.   PHYSICAL EXAMINATION:  VITAL SIGNS:  Blood pressure is 118/82, heart  rate 62, weight is 215 pounds.  GENERAL:  Well-nourished white male in no apparent distress.  HEENT:  Pupils are equal.  Conjunctivae clear.  NECK:  Supple, normal carotid upstroke and no carotid bruits.  LUNGS:  Clear breath sounds bilaterally.  HEART:  Regular rate and rhythm.  Normal S1 and S2.  No murmurs, rubs,  or gallops.  ABDOMEN:  Soft.  EXTREMITIES:  No cyanosis, clubbing, or edema.  SKIN:  A well-healed sternotomy scar.   PROBLEM LIST:  1. Coronary artery disease status post coronary artery bypass      grafting.  2. Hypertension.  3. Hypercholesterolemia.  4. Postoperative atrial fibrillation, resolved and off amiodarone.   PLAN:  1. The patient will need an EKG to establish a postoperative baseline.      I reviewed EKGs within  normal limits.  2. We have given the patient a prescription of trazodone for insomnia,      as this is likely      contributing to some decrease in his energy level.  I told him that      he can stop trazodone at his convenience or when he thinks that his      insomnia has resolved.     Learta Codding, MD,FACC  Electronically Signed    GED/MedQ  DD: 11/08/2008  DT: 11/09/2008  Job #: [id number]   cc:   Robert Mack

## 2010-11-06 NOTE — Discharge Summary (Signed)
NAME:  Robert Mack, Robert Mack                 ACCOUNT NO.:  [id number]   MEDICAL RECORD NO.:  [id number]           PATIENT TYPE:   LOCATION:                                 FACILITY:   PHYSICIAN:  Salvatore Decent. Cornelius Moras, M.D. DATE OF BIRTH:  14-Apr-1961   DATE OF ADMISSION:  DATE OF DISCHARGE:                               DISCHARGE SUMMARY   FINAL DIAGNOSIS:  Severe 2-vessel coronary artery disease.   IN-HOSPITAL DIAGNOSES:  1. Volume overload, postoperatively.  2. Acute blood loss anemia, postoperatively.   SECONDARY DIAGNOSES:  1. Hypertension.  2. Hyperlipidemia.   IN-HOSPITAL OPERATIONS AND PROCEDURES:  1. Cardiac catheterization on August 09, 2008, with left heart      catheterization, selective coronary angiography, left ventricular      angiogram with placement of Angio-Seal.  2. Coronary artery bypass grafting x3 using a left internal mammary      artery to the left anterior descending coronary artery, right      internal mammary artery to distal right coronary artery, saphenous      vein graft to first diagonal branch, endoscopic saphenous vein      harvest from right thigh.   THE PATIENT'S HISTORY AND PHYSICAL AND HOSPITAL COURSE:  The patient is  a 50 year old male who presents with new-onset angina pectoris.  The  patient has past medical history of hypertension and hyperlipidemia.  He  underwent stress test that was positive, prompting hospital admission to  [facility] in [place].  He was transferred to [facility]  where a cardiac catheterization was recommended and done on August 09, 2008.  This revealed severe 2-vessel coronary artery disease and normal  left ventricular function.  Following cardiac catheterization, Dr. Cornelius Moras  was consulted.  Dr. Cornelius Moras saw and evaluated the patient.  He discussed  with the patient undergoing coronary artery bypass grafting.  He  discussed risks and benefits with the patient.  The patient acknowledged  understanding and agreed  to proceed.  Surgery was scheduled for August 16, 2008.  Preoperatively, the patient did have bilateral carotid duplex  ultrasound done showing no significant ICA stenosis.  The patient  remained stable preoperatively.  For further details of the patient's  past medical history and physical exam, please see dictated H and P.   The patient was taken to the operating room on August 16, 2008, where  he underwent coronary artery bypass grafting x3 using a left internal  mammary artery to the left anterior descending coronary artery, right  internal mammary artery to distal right coronary artery, saphenous vein  graft to first diagonal branch.  Endoscopic saphenous vein harvest from  right thigh was done.  The patient tolerated this procedure well and was  transferred to the intensive care unit in stable condition.  Postoperatively, the patient was noted to be hemodynamically stable.  He  was able to be extubated on the evening of surgery.  Post extubation,  the patient was noted to be alert and oriented x4.  Neuro intact.  On  postoperative day 1, while in the intensive care unit,  all inotropic  drips were able to be weaned and discontinued.  Heart rate was noted to  be in normal sinus rhythm.  Blood pressure was stable.  A chest x-ray  done was clear.  He had minimal drainage from chest tubes, and chest  tube discontinued in normal fashion.  The patient remained  hemodynamically stable with mild acute blood loss anemia.  Cardiac rehab  got the patient out of bed and ambulated with him.  He tolerated it  well.  The patient was felt to be stable and ready for transfer to PCTU  on postop day 1.  The patient continued to progress well.  Followup  chest x-ray remained stable.  He did develop some mild volume overload  and was started on diuretics as blood pressure allows.  Daily weights  obtained and monitored.  The patient was improving from his volume  overload prior to discharge.  He  remained in normal sinus rhythm  postoperatively.  He continued to ambulate well with cardiac rehab.  He  was tolerating diet well.  No nausea or vomiting noted.  All incisions  remained clean, dry, and intact and healing well.  The patient was able  to be weaned off oxygen, sating greater than 90% on room air.   Postop day 3, August 19, 2008, vital signs showed blood pressure  stable, normal sinus rhythm.  He does have a low-grade temperature at  99.4.  Currently, we continued to wean the patient off oxygen.  He is on  2 liters at 95%, but we will continue to wean prior to discharge.  Most  recent lab work showed a sodium of 136, potassium of 4.0, chloride of  104, bicarbonate 25, BUN of 14, creatinine of 1.09, glucose of 111.  White blood cell count 10.7, hemoglobin 10.5, hematocrit 29.9, platelet  count of 109.  The patient is tentatively ready for discharge home in  the a.m. pending he remains stable and able to be weaned off oxygen.   FOLLOWUP APPOINTMENTS:  A followup appointment has been arranged with  Dr. Cornelius Moras for September 05, 2008, at 11:30 a.m.  The patient will need to  obtain PA and lateral chest x-ray 30 minutes prior to this appointment.  He will need to follow up with Dr. Vivia Ewing in 2 weeks.  He will need to  contact his office to make these arrangements.   ACTIVITY:  The patient is instructed no driving until released to do so,  no lifting over 10 pounds.  He is told to ambulate 3-4 times per day,  progress as tolerated, and continue his breathing exercises.   INCISIONAL CARE:  The patient is told to shower, washing his incisions  using soap and water.  He is to contact the office if he develops any  drainage or opening from any of his incision sites.   DIET:  The patient is educated on diet to be low fat, low salt.   DISCHARGE MEDICATIONS:  1. Zocor 20 mg at night.  2. Aspirin 325 mg daily.  3. Lopressor 25 mg b.i.d.  4. Lasix 20 mg daily x5 days.  5. Potassium  chloride 20 mEq daily.  6. Oxycodone 5 mg 1-2 tablets q.4-6 h. p.r.n.      Theda Belfast, PA      Salvatore Decent. Cornelius Moras, M.D.  Electronically Signed    KMD/MEDQ  D:  08/19/2008  T:  08/20/2008  Job:  [id number]

## 2010-11-09 NOTE — Cardiovascular Report (Signed)
NAME:  Robert Mack, SANS NO.:  [id number]   MEDICAL RECORD NO.:  [id number]          PATIENT TYPE:  OIB   LOCATION:  1961                         FACILITY:  [facility]   PHYSICIAN:  Jonelle Sidle, M.D. LHCDATE OF BIRTH:  12/31/1960   DATE OF PROCEDURE:  03/27/2005  DATE OF DISCHARGE:                              CARDIAC CATHETERIZATION   REQUESTING CARDIOLOGIST:  Dr. Willa Rough.   PRIMARY CARE PHYSICIAN:  Dr. Selinda Flavin .   INDICATIONS:  Mr. Compston is a 50 year old male with a history of  hypertension, hyperlipidemia and family history of premature cardiovascular  disease.  He  was recently admitted to [facility] with chest  pain that was felt to be most likely consistent with a gastrointestinal  etiology, although was still concerning, given his cardiac risk factors.  He  ruled out for myocardial infarction and was ultimately scheduled for an  outpatient cardiac catheterization to clearly assess the coronary anatomy.  Informed consent was obtained and signed prior to the procedure.   PROCEDURES PERFORMED:  1.  Left heart catheterization.  2.  Selective coronary angiography.  3.  Left ventriculography.   ACCESS AND EQUIPMENT:  The area about the right femoral artery was  anesthetized with 1% lidocaine and a 4-French sheath was placed in the right  femoral artery via the modified Seldinger technique.  Standard preformed 4-  [place] and JR4 catheters were used for selective coronary angiography  and an angled pigtail catheter was used for left heart catheterization and  ventriculography.  All exchanges were made over a wire and the patient  tolerated the procedure well without immediate complications.   HEMODYNAMIC RESULTS:  Aorta 123/80 mmHg.  Left ventricle 117/17 mmHg.   ANGIOGRAPHIC FINDINGS:  1.  The left main coronary artery is free of significant flow-limiting      coronary atherosclerosis.  2.  The left anterior descending is a  medium-caliber vessel with a large      bifurcating proximal diagonal branch.  There are tandem 20% and 30%      stenoses in the proximal left anterior descending as well as a 40%      ostial diagonal stenosis.  No major flow-limiting stenoses are noted.  3.  There is a large ramus intermedius branch without significant flow-      limiting coronary atherosclerosis.  4.  The circumflex coronary artery is small-to-medium in caliber with 3      obtuse marginal branches, the third of which is the largest and      bifurcates.  No significant flow-limiting coronary atherosclerosis is      noted.  5.  The right coronary artery is a large dominant vessel with a large      posterior descending and posterolateral branch.  There is 30% eccentric      stenosis in the proximal right coronary artery with no obvious thrombus      and with no major flow-limiting stenoses otherwise noted.   Left ventriculography was performed in the RAO projection and revealed an  ejection fraction of approximately 55% with  no obvious anterior apical or  inferior wall motion abnormality and trace mitral regurgitation.   DIAGNOSES:  1.  Mild coronary atherosclerosis as outlined above with no major      obstructive stenoses.  2.  Left ventricular ejection fraction of approximately 55% with a left      ventricle end-diastolic pressure of 17 mmHg and trace mitral      regurgitation.   DISCUSSION:  I reviewed the results with the patient and also went over the  films with the patient's wife and discussed them by phone with Dr. Myrtis Ser.  The results will also be relayed to Dr. Dimas Aguas through our [place] office.  At  this point, would suggest aggressive risk factor modification and close  followup.           ______________________________  Jonelle Sidle, M.D. [facility]     SGM/MEDQ  D:  03/27/2005  T:  03/27/2005  Job:  [id number]   cc:   Willa Rough, M.D.  [address]  [address]  [place]  [place] [address]   Selinda Flavin  Fax: [phone]

## 2010-11-09 NOTE — Letter (Signed)
November 28, 2008    Woodroe Chen  [address]  [place], [place] [address]   RE:  MITHCELL, SCHUMPERT  MRN:  [id number]  /  DOB:  [birth year]   Dear Ms. Testa:   I would like to inform you about the current medical condition of my  patient, Mr. Derrek Puff.  As you know this patient underwent coronary  bypass grafting and also had postoperative atrial fibrillation.  The  patient's disability forms were unfortunately filled in by one of my  partners.  He was actually not familiar with the patient's clinical  condition.  I understand that his short-term disability will be  terminated as of Nov 17, 2008.  As of this point the patient, in my  opinion, is not able to return to work yet.  The patient has still  ongoing chest pains related to his sternotomy incision as well as  significant fatigue that prevents him from doing any type of work in the  short-term.  Therefore, I feel very strongly that the patient should  continue his short-term disability until December 18, 2008 at which point he  will be reevaluated and hopefully can return back to work.   If you have any questions regarding this request, please call me  directly at my cell phone number [phone] to further discuss this.  I do anticipate the patient can return to work as of late June.  I  appreciate your attention to this issue.    Sincerely,      Learta Codding, MD,FACC  Electronically Signed   GED/MedQ  DD: 11/28/2008  DT: 11/28/2008  Job #: [id number]

## 2011-08-20 ENCOUNTER — Encounter: Payer: Self-pay | Admitting: *Deleted

## 2011-08-22 ENCOUNTER — Encounter: Payer: Self-pay | Admitting: Cardiology

## 2011-08-22 ENCOUNTER — Ambulatory Visit (INDEPENDENT_AMBULATORY_CARE_PROVIDER_SITE_OTHER): Payer: PRIVATE HEALTH INSURANCE | Admitting: Cardiology

## 2011-08-22 VITALS — BP 130/86 | HR 65 | Resp 18 | Ht 73.0 in | Wt 236.8 lb

## 2011-08-22 DIAGNOSIS — I2510 Atherosclerotic heart disease of native coronary artery without angina pectoris: Secondary | ICD-10-CM

## 2011-08-22 DIAGNOSIS — I10 Essential (primary) hypertension: Secondary | ICD-10-CM

## 2011-08-22 DIAGNOSIS — E785 Hyperlipidemia, unspecified: Secondary | ICD-10-CM

## 2011-08-22 NOTE — Patient Instructions (Signed)
Your physician recommends that you schedule a follow-up appointment in:1 year. You will receive a reminder letter in the mail 1-2 months in advance reminding you to call our office and schedule your appointment. If you don't receive this letter, please contact our office.  Your physician recommends that you continue on your current medications as directed. Please refer to the Current Medication list given to you today.   Please monitor your blood pressures at home making sure the diastolic number stays less than 85, if not please notify MD.

## 2011-08-25 ENCOUNTER — Encounter: Payer: Self-pay | Admitting: Cardiology

## 2011-08-25 DIAGNOSIS — E785 Hyperlipidemia, unspecified: Secondary | ICD-10-CM | POA: Insufficient documentation

## 2011-08-25 DIAGNOSIS — I10 Essential (primary) hypertension: Secondary | ICD-10-CM | POA: Insufficient documentation

## 2011-08-25 DIAGNOSIS — I2510 Atherosclerotic heart disease of native coronary artery without angina pectoris: Secondary | ICD-10-CM | POA: Insufficient documentation

## 2011-08-25 NOTE — Progress Notes (Signed)
Robert Bottoms, MD, [facility] ABIM Board Certified in Adult Cardiovascular Medicine,Internal Medicine and Critical Care Medicine    CC: followup patient with coronary artery disease and bypass surgery  HPI:  The patient is doing well from a cardiac perspective.  He reports no chest pain shortness of breath orthopnea or PND.  He does feel occasional palpitations which only last 10-15 min. But is otherwise asymptomatic with these.  He does have a prior history of postoperative atrial fibrillation.  Current EKG demonstrates normal sinus rhythm.  Diastolic blood pressures also mildly elevated. The patient is status post a recent pneumonia and is still complaining of some fatigue. From a cardiac standpoint is otherwise stable.   PMH: reviewed and listed in Problem List in Electronic Records (and see below) Past Medical History  Diagnosis Date  . HTN (hypertension)     borderline controlled  . HLD (hyperlipidemia)   . CAD (coronary artery disease)     status post coronary artery bypass grafting 2010 with normal LV function  . Atrial fibrillation     postoperatively   Past Surgical History  Procedure Date  . Cardiac catheterization 2006, 2010    left heart  . Coronary artery bypass graft 2010    x3    Allergies/SH/FHX : available in Electronic Records for review  No Known Allergies History   Social History  . Marital Status: Single    Spouse Name: N/A    Number of Children: N/A  . Years of Education: N/A   Occupational History  . Not on file.   Social History Main Topics  . Smoking status: Former Games developer  . Smokeless tobacco: Never Used   Comment: quit 1990  . Alcohol Use: No  . Drug Use: No  . Sexually Active: Not on file   Other Topics Concern  . Not on file   Social History Narrative  . No narrative on file   Family History  Problem Relation Age of Onset  . Heart disease      premature cardiovascular disease, FH  . Coronary artery disease       Medications: Current Outpatient Prescriptions  Medication Sig Dispense Refill  . aspirin 325 MG tablet Take 325 mg by mouth daily.      . metoprolol tartrate (LOPRESSOR) 25 MG tablet Take 25 mg by mouth 2 (two) times daily.      . simvastatin (ZOCOR) 20 MG tablet Take 20 mg by mouth every evening.        ROS: No nausea or vomiting. No fever or chills.No melena or hematochezia.No bleeding.No claudication  Physical Exam: BP 130/86  Pulse 65  Resp 18  Ht 6\' 1"  (1.854 m)  Wt 236 lb 12.8 oz (107.412 kg)  BMI 31.24 kg/m2  SpO2 98% General:well-nourished white male in no distress Neck:normal carotid upstroke and no carotid bruits Lungs:clear breath sounds bilaterally without wheezing Cardiac:regular rate and rhythm with normal S1 and S2 no murmur rubs or gallops Vascular:no edema.  Normal distal pulses Skin:warm and dry Physcologic:normal affect  12lead YNW:GNFAOZ sinus rhythm for R progression but otherwise normal tracing Limited bedside ECHO:N/A No images are attached to the encounter.    Patient Active Problem List  Diagnoses  . HYPERCHOLESTEROLEMIA  IIA  . PANIC DISORDER WITH AGORAPHOBIA-resolved  . CAD, ARTERY BYPASS GRAFT  . ATRIAL FIBRILLATION  . CAD (coronary artery disease)-no recurrent chest pain  . HLD (hyperlipidemia)  . HTN (hypertension)-borderline control    PLAN   Patient will follow up  with his blood pressure readings.  Her diastolic blood pressure remains above 85 mmHg he may need additional antihypertensive therapy.  No indication for routine ischemic testing at the present time.  The patient does report occasional palpitations and he did become more frequent further evaluation with cardiac monitor may be indicatedto make sure he does not have recurrent atrial fibrillation.

## 2011-09-12 DIAGNOSIS — R079 Chest pain, unspecified: Secondary | ICD-10-CM

## 2011-10-16 ENCOUNTER — Encounter: Payer: PRIVATE HEALTH INSURANCE | Admitting: Physician Assistant

## 2012-06-24 HISTORY — PX: CATARACT EXTRACTION: SUR2

## 2013-08-03 ENCOUNTER — Encounter: Payer: Self-pay | Admitting: Cardiovascular Disease

## 2013-08-03 ENCOUNTER — Ambulatory Visit (INDEPENDENT_AMBULATORY_CARE_PROVIDER_SITE_OTHER): Payer: Private Health Insurance - Indemnity | Admitting: Cardiovascular Disease

## 2013-08-03 VITALS — BP 148/70 | HR 66 | Ht 73.0 in | Wt 259.0 lb

## 2013-08-03 DIAGNOSIS — E785 Hyperlipidemia, unspecified: Secondary | ICD-10-CM

## 2013-08-03 DIAGNOSIS — I2510 Atherosclerotic heart disease of native coronary artery without angina pectoris: Secondary | ICD-10-CM

## 2013-08-03 DIAGNOSIS — G4730 Sleep apnea, unspecified: Secondary | ICD-10-CM

## 2013-08-03 DIAGNOSIS — R002 Palpitations: Secondary | ICD-10-CM

## 2013-08-03 DIAGNOSIS — I10 Essential (primary) hypertension: Secondary | ICD-10-CM

## 2013-08-03 DIAGNOSIS — I25810 Atherosclerosis of coronary artery bypass graft(s) without angina pectoris: Secondary | ICD-10-CM

## 2013-08-03 MED ORDER — ASPIRIN EC 81 MG PO TBEC
81.0000 mg | DELAYED_RELEASE_TABLET | Freq: Every day | ORAL | Status: DC
Start: 2013-08-03 — End: 2015-05-24

## 2013-08-03 MED ORDER — LISINOPRIL 5 MG PO TABS: 5.0000 mg | ORAL_TABLET | Freq: Every day | ORAL | Status: DC

## 2013-08-03 NOTE — Patient Instructions (Addendum)
   Decrease Aspirin to 81mg  daily   Begin Lisinopril 5mg  daily - new sent to pharm  Continue all other medications.   Your physician has recommended that you wear a 7 day event monitor. Event monitors are medical devices that record the heart's electrical activity. Doctors most often [place] these monitors to diagnose arrhythmias. Arrhythmias are problems with the speed or rhythm of the heartbeat. The monitor is a small, portable device. You can wear one while you do your normal daily activities. This is usually used to diagnose what is causing palpitations/syncope (passing out). Lab for BMET - due in 10 days (around 08/13/2013) Office will contact with results via phone or letter.    Follow up in  1 month

## 2013-08-03 NOTE — Progress Notes (Signed)
Patient ID: Robert Mack, male   DOB: 04-Apr-1961, 53 y.o.   MRN: [id number]      SUBJECTIVE: The patient is a 53 year old male with a history of coronary artery disease and CABG in 2010, hypertension, hyperlipidemia, and sleep apnea. He also has a history of postoperative atrial fibrillation. He underwent a stress echocardiogram in April 2014 which was negative for inducible ischemia. He had normal left ventricular systolic function, exercised for 7 minutes, and achieved 10 METs. He has been having palpitations which occur at least 3 or 4 times a week. He says that things are quite stressful at home as he is taking care of aging parents. 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 He says he gets very little physical activity and has put on at least 60 pounds since his bypass surgery. He thinks he is put on at least 20 pounds in the past one year itself. He leaves for work at 6 AM and returns at 5 and after taking care of his parents, he feels quite exhausted and not in the mood to exercise. His diet has not changed considerably in the past year. He does not smoke. He denies chest pain and shortness of breath. He also denies leg swelling, lightheadedness, dizziness and syncope. He sleeps with 2 pillows for comfort. He denies PND.    No Known Allergies  Current Outpatient Prescriptions  Medication Sig Dispense Refill  . aspirin 325 MG tablet Take 325 mg by mouth daily.      . metoprolol tartrate (LOPRESSOR) 25 MG tablet Take 25 mg by mouth 2 (two) times daily.      . simvastatin (ZOCOR) 20 MG tablet Take 20 mg by mouth every evening.       No current facility-administered medications for this visit.    Past Medical History  Diagnosis Date  . HTN (hypertension)     borderline controlled  . HLD (hyperlipidemia)   . CAD (coronary artery disease)     status post coronary artery bypass grafting 2010 with normal LV function  . Atrial fibrillation     postoperatively    Past  Surgical History  Procedure Laterality Date  . Cardiac catheterization  2006, 2010    left heart  . Coronary artery bypass graft  2010    x3    History   Social History  . Marital Status: Single    Spouse Name: N/A    Number of Children: N/A  . Years of Education: N/A   Occupational History  . Not on file.   Social History Main Topics  . Smoking status: Former Research scientist (life sciences)  . Smokeless tobacco: Never Used     Comment: quit 1990  . Alcohol Use: No  . Drug Use: No  . Sexual Activity: Not on file   Other Topics Concern  . Not on file   Social History Narrative  . No narrative on file     Filed Vitals:   08/03/13 1500  BP: 148/70  Pulse: 66  Height: 6\' 1"  (1.854 m)  Weight: 259 lb (117.482 kg)    PHYSICAL EXAM General: NAD Neck: No JVD, no thyromegaly or thyroid nodule.  Lungs: Clear to auscultation bilaterally with normal respiratory effort. CV: Nondisplaced PMI.  Heart regular S1/S2, no S3/S4, no murmur.  No peripheral edema.  No carotid bruit.  Normal pedal pulses.  Abdomen: Soft, nontender, no hepatosplenomegaly, no distention.  Neurologic: Alert and oriented x 3.  Psych: Normal affect. Extremities: No  clubbing or cyanosis.   ECG: reviewed and available in electronic records.      ASSESSMENT AND PLAN: 1. CAD/CABG: symptomatically stable with normal LV systolic function. Normal stress echo in 09/2012. Reduce ASA to 81 mg daily. 2. HTN: mildly elevated today. I gave him extensive counseling on the importance of regular exercise. I will start lisinopril 5 mg daily and check a BMET in 10 days to assess renal function. 3. Hyperlipidemia: lipids checked in 04/2013 by Dr. Nadara Mustard as per pt. Will obtain results. Continue simvastatin 20 mg daily. 4. Sleep apnea: uses CPAP and has noticed a significant improvement in his energy levels. 5. Palpitations: I will proceed with event monitoring for 7 days. For the time being, given his resting HR of 66 bpm on metoprolol 25 mg  bid, I will continue this present dose.  Dispo: f/u 1 month.   Kate Sable, M.D., F.A.C.C.

## 2013-08-09 ENCOUNTER — Other Ambulatory Visit: Payer: Self-pay | Admitting: *Deleted

## 2013-08-09 DIAGNOSIS — R002 Palpitations: Secondary | ICD-10-CM

## 2013-08-12 DIAGNOSIS — R002 Palpitations: Secondary | ICD-10-CM

## 2013-08-20 ENCOUNTER — Telehealth: Payer: Self-pay | Admitting: *Deleted

## 2013-08-20 NOTE — Telephone Encounter (Signed)
Message copied by Laurine Blazer on Fri Aug 20, 2013 12:07 PM ------      Message from: Kate Sable A      Created: Mon Aug 16, 2013  1:49 PM       Has CKD. Please forward results to PCP. This will need to be monitored. ------

## 2013-08-20 NOTE — Telephone Encounter (Signed)
Notes Recorded by Laurine Blazer, LPN on 3/38/3291 at 91:66 PM Patient notified. Has follow up scheduled with Dr. Bronson Ing for 09/05/2013. Stated he has never had issue with kidney function in the past. Advised to discuss further with MD at upcoming visit. Patient verbalized understanding. Copy forwarded to PMD.  Did not see diagnosis of CKD in recent dictation.

## 2013-09-01 ENCOUNTER — Other Ambulatory Visit: Payer: Self-pay | Admitting: *Deleted

## 2013-09-01 DIAGNOSIS — R002 Palpitations: Secondary | ICD-10-CM

## 2013-09-01 DIAGNOSIS — I471 Supraventricular tachycardia: Secondary | ICD-10-CM

## 2013-09-08 ENCOUNTER — Encounter: Payer: Self-pay | Admitting: Cardiovascular Disease

## 2013-09-08 ENCOUNTER — Ambulatory Visit (INDEPENDENT_AMBULATORY_CARE_PROVIDER_SITE_OTHER): Payer: Private Health Insurance - Indemnity | Admitting: Cardiovascular Disease

## 2013-09-08 VITALS — BP 145/86 | HR 73 | Ht 73.0 in | Wt 259.0 lb

## 2013-09-08 DIAGNOSIS — I10 Essential (primary) hypertension: Secondary | ICD-10-CM

## 2013-09-08 DIAGNOSIS — I471 Supraventricular tachycardia: Secondary | ICD-10-CM

## 2013-09-08 DIAGNOSIS — I25810 Atherosclerosis of coronary artery bypass graft(s) without angina pectoris: Secondary | ICD-10-CM

## 2013-09-08 DIAGNOSIS — G4730 Sleep apnea, unspecified: Secondary | ICD-10-CM

## 2013-09-08 DIAGNOSIS — R002 Palpitations: Secondary | ICD-10-CM

## 2013-09-08 DIAGNOSIS — E785 Hyperlipidemia, unspecified: Secondary | ICD-10-CM

## 2013-09-08 MED ORDER — METOPROLOL TARTRATE 50 MG PO TABS: 50.0000 mg | ORAL_TABLET | Freq: Two times a day (BID) | ORAL | Status: DC

## 2013-09-08 MED ORDER — LISINOPRIL 10 MG PO TABS: 10.0000 mg | ORAL_TABLET | Freq: Every day | ORAL | Status: DC

## 2013-09-08 NOTE — Patient Instructions (Signed)
   Increase Metoprolol tart to 50mg  twice a day    Increase Lisinopril to 10mg  daily   New meds to pharm for 90 day supply with 3 refills Continue all other medications.   Your physician wants you to follow up in: 6 months.  You will receive a reminder letter in the mail one-two months in advance.  If you don't receive a letter, please call our office to schedule the follow up appointment

## 2013-09-08 NOTE — Progress Notes (Signed)
Patient ID: Robert Mack, male   DOB: September 21, 1960, 53 y.o.   MRN: [id number]      SUBJECTIVE: The patient is a 53 year old male with a history of coronary artery disease and CABG in 2010, hypertension, hyperlipidemia, and sleep apnea. He also has a history of postoperative atrial fibrillation. He underwent a stress echocardiogram in April 2014 which was negative for inducible ischemia. He had normal left ventricular systolic function, exercised for 7 minutes, and achieved 10 METs.  He is here to follow up for palpitations, which he described at his last visit. They have been stable and have not progressed in frequency or severity. His 7 day event monitor showed a 3 second episode of supraventricular tachycardia, at which time he experienced palpitations.  BMET showed BUN 14/creatinine 1.33 with GFR 56 ml/min. Magnesium level: 2.    No Known Allergies  Current Outpatient Prescriptions  Medication Sig Dispense Refill  . aspirin EC 81 MG tablet Take 1 tablet (81 mg total) by mouth daily.      Marland Kitchen lisinopril (PRINIVIL,ZESTRIL) 5 MG tablet Take 1 tablet (5 mg total) by mouth daily.  30 tablet  6  . metoprolol tartrate (LOPRESSOR) 25 MG tablet Take 25 mg by mouth 2 (two) times daily.      . simvastatin (ZOCOR) 20 MG tablet Take 20 mg by mouth every evening.       No current facility-administered medications for this visit.    Past Medical History  Diagnosis Date  . HTN (hypertension)     borderline controlled  . HLD (hyperlipidemia)   . CAD (coronary artery disease)     status post coronary artery bypass grafting 2010 with normal LV function  . Atrial fibrillation     postoperatively    Past Surgical History  Procedure Laterality Date  . Cardiac catheterization  2006, 2010    left heart  . Coronary artery bypass graft  2010    x3    History   Social History  . Marital Status: Single    Spouse Name: N/A    Number of Children: N/A  . Years of Education: N/A   Occupational  History  . Not on file.   Social History Main Topics  . Smoking status: Former Research scientist (life sciences)  . Smokeless tobacco: Never Used     Comment: quit 1990  . Alcohol Use: No  . Drug Use: No  . Sexual Activity: Not on file   Other Topics Concern  . Not on file   Social History Narrative  . No narrative on file     Filed Vitals:   09/08/13 1452  BP: 145/86  Pulse: 73  Height: 6\' 1"  (1.854 m)  Weight: 259 lb (117.482 kg)    PHYSICAL EXAM General: NAD Neck: No JVD, no thyromegaly. Lungs: Clear to auscultation bilaterally with normal respiratory effort. CV: Nondisplaced PMI.  Regular rate and rhythm, normal S1/S2, no S3/S4, no murmur. No pretibial or periankle edema.  No carotid bruit.  Normal pedal pulses.  Abdomen: Soft, nontender, no hepatosplenomegaly, no distention.  Neurologic: Alert and oriented x 3.  Psych: Normal affect. Extremities: No clubbing or cyanosis.   ECG: reviewed and available in electronic records.      ASSESSMENT AND PLAN: 1. CAD/CABG: symptomatically stable with normal LV systolic function. Normal stress echo in 09/2012. Continue ASA and metoprolol. 2. HTN: mildly elevated again today. I will increase lisinopril to 10 mg daily. GFR 56 ml/min. 3. Hyperlipidemia: lipids checked in 04/2013 by Dr. Nadara Mustard  as per pt. Will obtain results. Continue simvastatin 20 mg daily.  4. Sleep apnea: uses CPAP and has noticed a significant improvement in his energy levels.  5. Palpitations/PSVT: I will increase metoprolol to 50 mg bid. I have asked him to inform me should he experience fatigue or any other untoward side effects.   Dispo: f/u 6 months.    Kate Sable, M.D., F.A.C.C.

## 2013-10-29 ENCOUNTER — Encounter: Payer: Self-pay | Admitting: Cardiovascular Disease

## 2014-03-17 ENCOUNTER — Ambulatory Visit (INDEPENDENT_AMBULATORY_CARE_PROVIDER_SITE_OTHER): Payer: Private Health Insurance - Indemnity | Admitting: Cardiovascular Disease

## 2014-03-17 ENCOUNTER — Encounter: Payer: Self-pay | Admitting: Cardiovascular Disease

## 2014-03-17 VITALS — BP 157/105 | HR 98 | Ht 73.0 in | Wt 255.5 lb

## 2014-03-17 DIAGNOSIS — I10 Essential (primary) hypertension: Secondary | ICD-10-CM

## 2014-03-17 DIAGNOSIS — G4730 Sleep apnea, unspecified: Secondary | ICD-10-CM

## 2014-03-17 DIAGNOSIS — I25810 Atherosclerosis of coronary artery bypass graft(s) without angina pectoris: Secondary | ICD-10-CM

## 2014-03-17 DIAGNOSIS — I471 Supraventricular tachycardia: Secondary | ICD-10-CM

## 2014-03-17 DIAGNOSIS — E785 Hyperlipidemia, unspecified: Secondary | ICD-10-CM

## 2014-03-17 DIAGNOSIS — R002 Palpitations: Secondary | ICD-10-CM

## 2014-03-17 NOTE — Patient Instructions (Signed)
There were no changes to your medications. Continue as directed. Your physician wants you to follow up in: 6 months.  You will receive a reminder letter in the mail one-two months in advance.  If you don't receive a letter, please call our office to schedule the follow up appointment.

## 2014-03-17 NOTE — Progress Notes (Signed)
Patient ID: Robert Mack, male   DOB: 1961-02-07, 53 y.o.   MRN: [id number]      SUBJECTIVE: The patient is a 53 year old male with a history of coronary artery disease and CABG in 2010, hypertension, hyperlipidemia, PSVT, and sleep apnea. He also has a history of postoperative atrial fibrillation. He underwent a stress echocardiogram in April 2014 which was negative for inducible ischemia. He had normal left ventricular systolic function, exercised for 7 minutes, and achieved 10 METs.  His mother passed away earlier this month and he has been dealing with anxiety and grief over this. He recently contracted an upper respiratory infection has been taking azithromycin as well as prednisone and a nasal decongestant. His blood pressure was 110/70 on Monday at his PCPs office. He denies chest pain and occasionally has palpitations.   Review of Systems: As per "subjective", otherwise negative.  No Known Allergies  Current Outpatient Prescriptions  Medication Sig Dispense Refill  . aspirin EC 81 MG tablet Take 1 tablet (81 mg total) by mouth daily.      Marland Kitchen lisinopril (PRINIVIL,ZESTRIL) 10 MG tablet Take 1 tablet (10 mg total) by mouth daily.  90 tablet  3  . metoprolol tartrate (LOPRESSOR) 50 MG tablet Take 1 tablet (50 mg total) by mouth 2 (two) times daily.  180 tablet  3  . simvastatin (ZOCOR) 20 MG tablet Take 20 mg by mouth every evening.       No current facility-administered medications for this visit.    Past Medical History  Diagnosis Date  . HTN (hypertension)     borderline controlled  . HLD (hyperlipidemia)   . CAD (coronary artery disease)     status post coronary artery bypass grafting 2010 with normal LV function  . Atrial fibrillation     postoperatively    Past Surgical History  Procedure Laterality Date  . Cardiac catheterization  2006, 2010    left heart  . Coronary artery bypass graft  2010    x3    History   Social History  . Marital Status: Single   Spouse Name: N/A    Number of Children: N/A  . Years of Education: N/A   Occupational History  . Not on file.   Social History Main Topics  . Smoking status: Former Smoker    Start date: 06/25/1971    Quit date: 06/24/1988  . Smokeless tobacco: Never Used     Comment: quit 1990  . Alcohol Use: Yes     Comment: Socially-beer  . Drug Use: No  . Sexual Activity: Not on file   Other Topics Concern  . Not on file   Social History Narrative  . No narrative on file     Filed Vitals:   03/17/14 1459  BP: 157/105  Pulse: 98  Height: 6\' 1"  (1.854 m)  Weight: 255 lb 8 oz (115.894 kg)  SpO2: 95%    PHYSICAL EXAM General: NAD HEENT: Normal. Neck: No JVD, no thyromegaly. Lungs: Clear to auscultation bilaterally with normal respiratory effort. CV: Nondisplaced PMI.  Regular rate and rhythm, normal S1/S2, no S3/S4, no murmur. No pretibial or periankle edema.  No carotid bruit.  Normal pedal pulses.  Abdomen: Soft, nontender, no hepatosplenomegaly, no distention.  Neurologic: Alert and oriented x 3.  Psych: Normal affect. Skin: Normal. Musculoskeletal: Normal range of motion, no gross deformities. Extremities: No clubbing or cyanosis.   ECG: Most recent ECG reviewed.      ASSESSMENT AND PLAN: 1. CAD/CABG: symptomatically  stable with normal LV systolic function. Normal stress echo in 09/2012. Continue ASA and metoprolol.  2. Essential HTN: Markedly elevated, but reportedly normal earlier this week, and may be exacerbated by steroids and nasal decongestants. I have asked the patient to check blood pressure readings 3-4 times per week, at different times throughout the day, in order to get a better approximation of mean BP values. These results will be provided to me at the end of that period so that I can determine if antihypertensive medication titration is indicated. For the time being, continue lisinopril 10 mg. 3. Hyperlipidemia: Will obtain copy of most recent lipids from  PCP's office. Continue simvastatin 20 mg daily.  4. Sleep apnea: Treated with CPAP. 5. Palpitations/PSVT: Continue metoprolol 50 mg bid.  Dispo: f/u 6 months.   Kate Sable, M.D., F.A.C.C.

## 2014-06-24 HISTORY — PX: LUMBAR FUSION: SHX111

## 2014-08-29 ENCOUNTER — Ambulatory Visit (INDEPENDENT_AMBULATORY_CARE_PROVIDER_SITE_OTHER): Payer: 59 | Admitting: Cardiovascular Disease

## 2014-08-29 ENCOUNTER — Encounter: Payer: Self-pay | Admitting: Cardiovascular Disease

## 2014-08-29 VITALS — BP 132/80 | HR 79 | Ht 73.0 in | Wt 259.0 lb

## 2014-08-29 DIAGNOSIS — G4730 Sleep apnea, unspecified: Secondary | ICD-10-CM

## 2014-08-29 DIAGNOSIS — I10 Essential (primary) hypertension: Secondary | ICD-10-CM

## 2014-08-29 DIAGNOSIS — I471 Supraventricular tachycardia, unspecified: Secondary | ICD-10-CM

## 2014-08-29 DIAGNOSIS — E785 Hyperlipidemia, unspecified: Secondary | ICD-10-CM

## 2014-08-29 DIAGNOSIS — I25810 Atherosclerosis of coronary artery bypass graft(s) without angina pectoris: Secondary | ICD-10-CM | POA: Diagnosis not present

## 2014-08-29 NOTE — Patient Instructions (Signed)
Continue all current medications. Your physician wants you to follow up in:  1 year.  You will receive a reminder letter in the mail one-two months in advance.  If you don't receive a letter, please call our office to schedule the follow up appointment   

## 2014-08-29 NOTE — Progress Notes (Addendum)
Patient ID: Robert Mack, male   DOB: 02-Apr-1961, 54 y.o.   MRN: [id number]      SUBJECTIVE: The patient presents for routine cardiovascular follow-up. He has a history of coronary artery disease and CABG in 2010, essential hypertension, hyperlipidemia, PSVT, and sleep apnea. He also has a history of postoperative atrial fibrillation. He underwent a stress echocardiogram in April 2014 which was negative for inducible ischemia. He had normal left ventricular systolic function, exercised for 7 minutes, and achieved 10 METs.  He denies chest pain, palpitations, and shortness of breath.  He injured his lumbar spine and has a bulging disc when he was lifting furniture in October 2015, and may need surgery.   ECG performed in the office today demonstrates normal sinus rhythm with no ischemic ST segment or T-wave abnormalities.  Review of Systems: As per "subjective", otherwise negative.  No Known Allergies  Current Outpatient Prescriptions  Medication Sig Dispense Refill  . aspirin EC 81 MG tablet Take 1 tablet (81 mg total) by mouth daily.    Marland Kitchen lisinopril (PRINIVIL,ZESTRIL) 10 MG tablet Take 1 tablet (10 mg total) by mouth daily. 90 tablet 3  . metoprolol tartrate (LOPRESSOR) 50 MG tablet Take 1 tablet (50 mg total) by mouth 2 (two) times daily. 180 tablet 3  . simvastatin (ZOCOR) 20 MG tablet Take 20 mg by mouth every evening.     No current facility-administered medications for this visit.    Past Medical History  Diagnosis Date  . HTN (hypertension)     borderline controlled  . HLD (hyperlipidemia)   . CAD (coronary artery disease)     status post coronary artery bypass grafting 2010 with normal LV function  . Atrial fibrillation     postoperatively    Past Surgical History  Procedure Laterality Date  . Cardiac catheterization  2006, 2010    left heart  . Coronary artery bypass graft  2010    x3    History   Social History  . Marital Status: Married    Spouse Name: N/A    . Number of Children: N/A  . Years of Education: N/A   Occupational History  . Not on file.   Social History Main Topics  . Smoking status: Former Smoker    Start date: 06/25/1971    Quit date: 06/24/1988  . Smokeless tobacco: Never Used     Comment: quit 1990  . Alcohol Use: Yes     Comment: Socially-beer  . Drug Use: No  . Sexual Activity: Not on file   Other Topics Concern  . Not on file   Social History Narrative     Filed Vitals:   08/29/14 1616  BP: 132/80  Pulse: 79  Height: 6\' 1"  (1.854 m)  Weight: 259 lb (117.482 kg)  SpO2: 97%    PHYSICAL EXAM General: NAD HEENT: Normal. Neck: No JVD, no thyromegaly. Lungs: Clear to auscultation bilaterally with normal respiratory effort. CV: Nondisplaced PMI.  Regular rate and rhythm, normal S1/S2, no S3/S4, no murmur. No pretibial or periankle edema.  No carotid bruit.  Normal pedal pulses.  Abdomen: Soft, obese, no distention.  Neurologic: Alert and oriented x 3.  Psych: Normal affect. Skin: Normal. Musculoskeletal: Normal range of motion, no gross deformities. Extremities: No clubbing or cyanosis.   ECG: Most recent ECG reviewed.      ASSESSMENT AND PLAN: 1. CAD/CABG: Symptomatically stable with normal LV systolic function. Normal stress echo in 09/2012. Continue ASA, statin, and metoprolol.  2.  Essential HTN: Well controlled on current therapy. No changes. 3. Hyperlipidemia: Will obtain copy of most recent lipids from PCP's (Dr. Nadara Mustard) office. Continue simvastatin 20 mg daily.  4. Sleep apnea: Treated with CPAP. 5. Palpitations/PSVT: Continue metoprolol 50 mg bid.  Dispo: f/u 1 year.   Kate Sable, M.D., F.A.C.C.

## 2014-08-30 ENCOUNTER — Encounter: Payer: Self-pay | Admitting: *Deleted

## 2014-09-13 ENCOUNTER — Other Ambulatory Visit: Payer: Self-pay | Admitting: Cardiovascular Disease

## 2014-09-24 ENCOUNTER — Other Ambulatory Visit: Payer: Self-pay | Admitting: Cardiovascular Disease

## 2015-05-24 ENCOUNTER — Ambulatory Visit (HOSPITAL_COMMUNITY): Payer: 59 | Admitting: Anesthesiology

## 2015-05-24 ENCOUNTER — Encounter (HOSPITAL_COMMUNITY)
Admission: AD | Disposition: A | Discharge status: Home / Self Care | Payer: Self-pay | Source: Ambulatory Visit | Attending: Internal Medicine

## 2015-05-24 ENCOUNTER — Ambulatory Visit (HOSPITAL_COMMUNITY)
Admission: AD | Admit: 2015-05-24 | Discharge: 2015-05-24 | Disposition: A | Discharge status: Home / Self Care | Payer: 59 | Source: Ambulatory Visit | Attending: Internal Medicine | Admitting: Internal Medicine

## 2015-05-24 ENCOUNTER — Encounter (HOSPITAL_COMMUNITY): Payer: Self-pay | Admitting: *Deleted

## 2015-05-24 ENCOUNTER — Ambulatory Visit (HOSPITAL_COMMUNITY): Payer: 59

## 2015-05-24 ENCOUNTER — Encounter (INDEPENDENT_AMBULATORY_CARE_PROVIDER_SITE_OTHER): Payer: Self-pay

## 2015-05-24 DIAGNOSIS — Z9049 Acquired absence of other specified parts of digestive tract: Secondary | ICD-10-CM | POA: Insufficient documentation

## 2015-05-24 DIAGNOSIS — Z87891 Personal history of nicotine dependence: Secondary | ICD-10-CM | POA: Diagnosis not present

## 2015-05-24 DIAGNOSIS — Z7982 Long term (current) use of aspirin: Secondary | ICD-10-CM | POA: Insufficient documentation

## 2015-05-24 DIAGNOSIS — R17 Unspecified jaundice: Secondary | ICD-10-CM | POA: Diagnosis not present

## 2015-05-24 DIAGNOSIS — G4730 Sleep apnea, unspecified: Secondary | ICD-10-CM | POA: Diagnosis not present

## 2015-05-24 DIAGNOSIS — K831 Obstruction of bile duct: Secondary | ICD-10-CM | POA: Insufficient documentation

## 2015-05-24 DIAGNOSIS — I2510 Atherosclerotic heart disease of native coronary artery without angina pectoris: Secondary | ICD-10-CM | POA: Diagnosis not present

## 2015-05-24 DIAGNOSIS — E785 Hyperlipidemia, unspecified: Secondary | ICD-10-CM | POA: Diagnosis not present

## 2015-05-24 DIAGNOSIS — F419 Anxiety disorder, unspecified: Secondary | ICD-10-CM | POA: Insufficient documentation

## 2015-05-24 DIAGNOSIS — Z79899 Other long term (current) drug therapy: Secondary | ICD-10-CM | POA: Insufficient documentation

## 2015-05-24 DIAGNOSIS — Z951 Presence of aortocoronary bypass graft: Secondary | ICD-10-CM | POA: Diagnosis not present

## 2015-05-24 DIAGNOSIS — K8050 Calculus of bile duct without cholangitis or cholecystitis without obstruction: Secondary | ICD-10-CM

## 2015-05-24 DIAGNOSIS — I4891 Unspecified atrial fibrillation: Secondary | ICD-10-CM | POA: Insufficient documentation

## 2015-05-24 DIAGNOSIS — K838 Other specified diseases of biliary tract: Secondary | ICD-10-CM | POA: Diagnosis not present

## 2015-05-24 DIAGNOSIS — I10 Essential (primary) hypertension: Secondary | ICD-10-CM | POA: Diagnosis not present

## 2015-05-24 DIAGNOSIS — R932 Abnormal findings on diagnostic imaging of liver and biliary tract: Secondary | ICD-10-CM | POA: Diagnosis not present

## 2015-05-24 HISTORY — DX: Anxiety disorder, unspecified: F41.9

## 2015-05-24 HISTORY — DX: Sleep apnea, unspecified: G47.30

## 2015-05-24 HISTORY — PX: BILIARY STENT PLACEMENT: SHX5538

## 2015-05-24 HISTORY — PX: ERCP: SHX5425

## 2015-05-24 HISTORY — PX: SPHINCTEROTOMY: SHX5544

## 2015-05-24 LAB — CBC
HCT: 45.4 % (ref 39.0–52.0)
Hemoglobin: 15.6 g/dL (ref 13.0–17.0)
MCH: 32.3 pg (ref 26.0–34.0)
MCHC: 34.4 g/dL (ref 30.0–36.0)
MCV: 94 fL (ref 78.0–100.0)
Platelets: 206 10*3/uL (ref 150–400)
RBC: 4.83 MIL/uL (ref 4.22–5.81)
RDW: 13.6 % (ref 11.5–15.5)
WBC: 6.8 10*3/uL (ref 4.0–10.5)

## 2015-05-24 LAB — PROTIME-INR
INR: 0.96 (ref 0.00–1.49)
Prothrombin Time: 13 seconds (ref 11.6–15.2)

## 2015-05-24 SURGERY — ERCP, WITH INTERVENTION IF INDICATED
Anesthesia: General

## 2015-05-24 MED ORDER — STERILE WATER FOR IRRIGATION IR SOLN
Status: DC | PRN
  Administered 2015-05-24: 1000 mL

## 2015-05-24 MED ORDER — HYDROCODONE-ACETAMINOPHEN 5-325 MG PO TABS: 1.0000 | ORAL_TABLET | Freq: Four times a day (QID) | ORAL | Status: DC | PRN

## 2015-05-24 MED ORDER — ONDANSETRON HCL 4 MG/2ML IJ SOLN: 4.0000 mg | Freq: Once | INTRAMUSCULAR | Status: DC | PRN

## 2015-05-24 MED ORDER — FENTANYL CITRATE (PF) 100 MCG/2ML IJ SOLN
INTRAMUSCULAR | Status: DC | PRN
  Administered 2015-05-24: 50 ug via INTRAVENOUS
  Administered 2015-05-24 (×4): 25 ug via INTRAVENOUS
  Administered 2015-05-24: 50 ug via INTRAVENOUS

## 2015-05-24 MED ORDER — BUTAMBEN-TETRACAINE-BENZOCAINE 2-2-14 % EX AERO
INHALATION_SPRAY | CUTANEOUS | Status: AC
  Administered 2015-05-24: 2
  Filled 2015-05-24: qty 20

## 2015-05-24 MED ORDER — BUTAMBEN-TETRACAINE-BENZOCAINE 2-2-14 % EX AERO
2.0000 | INHALATION_SPRAY | Freq: Once | CUTANEOUS | Status: DC
  Filled 2015-05-24: qty 20

## 2015-05-24 MED ORDER — FENTANYL CITRATE (PF) 100 MCG/2ML IJ SOLN
INTRAMUSCULAR | Status: AC
  Filled 2015-05-24: qty 2

## 2015-05-24 MED ORDER — FENTANYL CITRATE (PF) 100 MCG/2ML IJ SOLN: 25.0000 ug | INTRAMUSCULAR | Status: DC | PRN

## 2015-05-24 MED ORDER — SODIUM CHLORIDE 0.9 % IV SOLN
INTRAVENOUS | Status: DC | PRN
  Administered 2015-05-24: 100 mL

## 2015-05-24 MED ORDER — PROPOFOL 10 MG/ML IV BOLUS
INTRAVENOUS | Status: DC | PRN
  Administered 2015-05-24: 50 mg via INTRAVENOUS
  Administered 2015-05-24: 40 mg via INTRAVENOUS
  Administered 2015-05-24: 160 mg via INTRAVENOUS

## 2015-05-24 MED ORDER — NEOSTIGMINE METHYLSULFATE 10 MG/10ML IV SOLN
INTRAVENOUS | Status: DC | PRN
Start: 2015-05-24 — End: 2015-05-24
  Administered 2015-05-24: 4 mg via INTRAVENOUS

## 2015-05-24 MED ORDER — SODIUM CHLORIDE 0.9 % IV SOLN
INTRAVENOUS | Status: AC
  Filled 2015-05-24: qty 50

## 2015-05-24 MED ORDER — MIDAZOLAM HCL 2 MG/2ML IJ SOLN
1.0000 mg | INTRAMUSCULAR | Status: DC | PRN
  Administered 2015-05-24 (×2): 2 mg via INTRAVENOUS
  Filled 2015-05-24: qty 2

## 2015-05-24 MED ORDER — VITAMIN K1 10 MG/ML IJ SOLN
10.0000 mg | Freq: Once | INTRAMUSCULAR | Status: DC
  Filled 2015-05-24: qty 1

## 2015-05-24 MED ORDER — SUCCINYLCHOLINE CHLORIDE 20 MG/ML IJ SOLN
INTRAMUSCULAR | Status: DC | PRN
  Administered 2015-05-24: 140 mg via INTRAVENOUS

## 2015-05-24 MED ORDER — PHENYLEPHRINE 40 MCG/ML (10ML) SYRINGE FOR IV PUSH (FOR BLOOD PRESSURE SUPPORT)
PREFILLED_SYRINGE | INTRAVENOUS | Status: AC
  Filled 2015-05-24: qty 10

## 2015-05-24 MED ORDER — METOCLOPRAMIDE HCL 5 MG/ML IJ SOLN
10.0000 mg | Freq: Once | INTRAMUSCULAR | Status: AC
  Administered 2015-05-24: 10 mg via INTRAVENOUS
  Filled 2015-05-24: qty 2

## 2015-05-24 MED ORDER — LACTATED RINGERS IV SOLN
INTRAVENOUS | Status: DC
  Administered 2015-05-24 (×2): via INTRAVENOUS

## 2015-05-24 MED ORDER — ONDANSETRON HCL 4 MG PO TABS: 4.0000 mg | ORAL_TABLET | Freq: Three times a day (TID) | ORAL | Status: DC | PRN

## 2015-05-24 MED ORDER — LIDOCAINE HCL (CARDIAC) 20 MG/ML IV SOLN
INTRAVENOUS | Status: DC | PRN
  Administered 2015-05-24: 50 mg via INTRAVENOUS

## 2015-05-24 MED ORDER — VITAMIN K1 10 MG/ML IJ SOLN
INTRAMUSCULAR | Status: DC | PRN
  Administered 2015-05-24: 10 mg via SUBCUTANEOUS

## 2015-05-24 MED ORDER — GLUCAGON HCL RDNA (DIAGNOSTIC) 1 MG IJ SOLR
INTRAMUSCULAR | Status: AC
  Administered 2015-05-24 (×3): 0.25 mg via INTRAVENOUS
  Filled 2015-05-24: qty 2

## 2015-05-24 MED ORDER — GLYCOPYRROLATE 0.2 MG/ML IJ SOLN
INTRAMUSCULAR | Status: AC
  Filled 2015-05-24: qty 3

## 2015-05-24 MED ORDER — GLYCOPYRROLATE 0.2 MG/ML IJ SOLN
INTRAMUSCULAR | Status: DC | PRN
  Administered 2015-05-24: 0.6 mg via INTRAVENOUS

## 2015-05-24 MED ORDER — GLYCOPYRROLATE 0.2 MG/ML IJ SOLN
INTRAMUSCULAR | Status: AC
  Filled 2015-05-24: qty 1

## 2015-05-24 MED ORDER — ROCURONIUM BROMIDE 100 MG/10ML IV SOLN
INTRAVENOUS | Status: DC | PRN
  Administered 2015-05-24: 5 mg via INTRAVENOUS
  Administered 2015-05-24 (×2): 10 mg via INTRAVENOUS

## 2015-05-24 MED ORDER — MIDAZOLAM HCL 2 MG/2ML IJ SOLN
INTRAMUSCULAR | Status: AC
  Filled 2015-05-24: qty 2

## 2015-05-24 SURGICAL SUPPLY — 26 items
BAG HAMPER (MISCELLANEOUS) ×1 IMPLANT
BALLN RETRIEVAL 12X15 (BALLOONS) IMPLANT
BALLN RETRIEVAL 12X15MM (BALLOONS)
BALN RTRVL 200 6-7FR 12-15 (BALLOONS)
BASKET TRAPEZOID 3X6 (MISCELLANEOUS) IMPLANT
BSKT STON RTRVL TRAPEZOID 3X6 (MISCELLANEOUS)
DEVICE INFLATION ENCORE 26 (MISCELLANEOUS) IMPLANT
DEVICE LOCKING W-BIOPSY CAP (MISCELLANEOUS) ×1 IMPLANT
GUIDEWIRE HYDRA JAGWIRE .35 (WIRE) IMPLANT
GUIDEWIRE JAG HINI 025X260CM (WIRE) IMPLANT
KIT ENDO PROCEDURE PEN (KITS) ×1 IMPLANT
KIT ROOM TURNOVER APOR (KITS) ×1 IMPLANT
LUBRICANT JELLY 4.5OZ STERILE (MISCELLANEOUS) IMPLANT
PAD ARMBOARD 7.5X6 YLW CONV (MISCELLANEOUS) ×1 IMPLANT
PATHFINDER 450CM 0.18 (STENTS) IMPLANT
POSITIONER HEAD 8X9X4 ADT (SOFTGOODS) IMPLANT
SCOPE SPY DS DISPOSABLE (MISCELLANEOUS) ×1 IMPLANT
SNARE ROTATE MED OVAL 20MM (MISCELLANEOUS) IMPLANT
SNARE SHORT THROW 13M SML OVAL (MISCELLANEOUS) IMPLANT
SPHINCTEROTOME AUTOTOME .25 (MISCELLANEOUS) ×2 IMPLANT
SPHINCTEROTOME HYDRATOME 44 (MISCELLANEOUS) ×1 IMPLANT
SYSTEM CONTINUOUS INJECTION (MISCELLANEOUS) ×1 IMPLANT
TUBING INSUFFLATOR CO2MPACT (TUBING) ×1 IMPLANT
WALLSTENT METAL COVERED 10X60 (STENTS) IMPLANT
WALLSTENT METAL COVERED 10X80 (STENTS) IMPLANT
WATER STERILE IRR 1000ML POUR (IV SOLUTION) ×1 IMPLANT

## 2015-05-24 NOTE — Anesthesia Preprocedure Evaluation (Signed)
Anesthesia Evaluation  Patient identified by MRN, date of birth, ID band Patient awake    Reviewed: Allergy & Precautions, NPO status , Patient's Chart, lab work & pertinent test results, reviewed documented beta blocker date and time   Airway Mallampati: III  TM Distance: >3 FB     Dental  (+) Teeth Intact, Dental Advisory Given   Pulmonary sleep apnea and Continuous Positive Airway Pressure Ventilation , former smoker,    Pulmonary exam normal        Cardiovascular hypertension, Pt. on medications and Pt. on home beta blockers + CAD and + CABG  Normal cardiovascular exam+ dysrhythmias Atrial Fibrillation      Neuro/Psych Anxiety    GI/Hepatic   Endo/Other    Renal/GU Renal InsufficiencyRenal disease     Musculoskeletal   Abdominal Normal abdominal exam  (+)   Peds  Hematology   Anesthesia Other Findings   Reproductive/Obstetrics                             Anesthesia Physical Anesthesia Plan  ASA: III  Anesthesia Plan: General   Post-op Pain Management:    Induction: Intravenous and Rapid sequence  Airway Management Planned: Oral ETT  Additional Equipment:   Intra-op Plan:   Post-operative Plan: Extubation in OR  Informed Consent: I have reviewed the patients History and Physical, chart, labs and discussed the procedure including the risks, benefits and alternatives for the proposed anesthesia with the patient or authorized representative who has indicated his/her understanding and acceptance.   Dental advisory given  Plan Discussed with: CRNA  Anesthesia Plan Comments:         Anesthesia Quick Evaluation

## 2015-05-24 NOTE — Anesthesia Procedure Notes (Signed)
Procedure Name: Intubation Date/Time: 05/24/2015 3:05 PM Performed by: Andree Elk, Cimone Fahey A Pre-anesthesia Checklist: Patient identified, Patient being monitored, Timeout performed, Emergency Drugs available and Suction available Patient Re-evaluated:Patient Re-evaluated prior to inductionOxygen Delivery Method: Circle System Utilized Preoxygenation: Pre-oxygenation with 100% oxygen Intubation Type: IV induction, Rapid sequence and Cricoid Pressure applied Laryngoscope Size: 3 and Miller Grade View: Grade I Tube type: Oral Tube size: 7.0 mm Number of attempts: 1 Airway Equipment and Method: Stylet Placement Confirmation: ETT inserted through vocal cords under direct vision,  positive ETCO2 and breath sounds checked- equal and bilateral Secured at: 21 cm Tube secured with: Tape Dental Injury: Teeth and Oropharynx as per pre-operative assessment

## 2015-05-24 NOTE — Transfer of Care (Signed)
Immediate Anesthesia Transfer of Care Note  Patient: Robert Mack  Procedure(s) Performed: Procedure(s): ENDOSCOPIC RETROGRADE CHOLANGIOPANCREATOGRAPHY (ERCP) (N/A) SPHINCTEROTOMY (N/A) BILIARY STENT PLACEMENT (N/A)  Patient Location: PACU  Anesthesia Type:General  Level of Consciousness: awake, oriented and patient cooperative  Airway & Oxygen Therapy: Patient Spontanous Breathing and Patient connected to face mask oxygen  Post-op Assessment: Report given to RN and Post -op Vital signs reviewed and stable  Post vital signs: Reviewed and stable  Last Vitals:  Filed Vitals:   05/24/15 1430 05/24/15 1652  BP: 131/74 124/61  Pulse:  88  Resp: 13 23    Complications: No apparent anesthesia complications

## 2015-05-24 NOTE — Op Note (Signed)
ERCP PROCEDURE REPORT  PATIENT:  Robert Mack  MR#:  [id number] Birthdate:  1960-09-30, 54 y.o., male Endoscopist:  Dr. Rogene Houston, MD Referred By:  Dr. Rory Percy, MD  Procedure Date: 05/24/2015  Procedure:   ERCP with biliary sphincterotomy and stenting.    Indications:  Patient is 54 year old Caucasian male who presents with painless jaundice. Imaging studies revealed markedly dilated biliary system and a small stone in distal CBD. There is also soft tissue density in ampullary region.            Informed Consent:  The risks, benefits, limitations, alternatives, and mponderable have been reviewed with the patient. I specifically discussed a 1 in 10 chance of pancreatitis, reaction to medications, bleeding, perforation and the possibility of a failed ERCP. Potential for sphincterotomy and stent placement also reviewed. Questions have been answered. All parties agreeable.  Please see history & physical in medical record for more information.  Medications:  Gen. endotracheal anesthesia  Please see anesthesia record for complete details  Description of procedure:  Procedure performed in the OR. The patient was placed under anesthesia, intubated, and turned into semipermanent position. Therapeutic Pentax video duodenoscope passed through the oropharynx without any difficulty into the esophagus, stomach, and across the pylorus and pull, and descending duodenum.  Cannulation was performed with Rx 44 autotome and 035 hydrojag wire. Selective cannulation of CBD was difficult.  Findings:  Normal ampulla of Vater. Normal pancreatogram. Markedly dilated CBD CHD and intrahepatic biliary radicles with distal obstruction which was not clearly delineated because of size of bile duct. No filling defects noted.  Therapeutic/Diagnostic Maneuvers Performed:   Biliary sphincterotomy performed. Unable to advance 10 French stent across narrow segment at distal CBD. Therefore 8.5 French 7 cm long  plastic stent placed for biliary decompression.  Complications:  None  EBL: None  Impression:  Normal ampulla Vater. Normal pancreatogram. Markedly dilated biliary system with obstruction involving distal segment of bile duct due to high grade stricture. No stone identified as suspected on CT. Biliary system decompressed with 8.5 French 7 cm long plastic stent.  Recommendations:  Standard instructions given. Clear liquids today. Ondansetron 4 mg 3 times a day when necessary. Hydrocodone/acetaminophen 5/325 one tablet by mouth every 6 when necessary. Endoscopic ultrasound to be arranged next week. Will check LFTs next week.  REHMAN,NAJEEB U  05/24/2015  4:57 PM  CC: Dr. Rory Percy, MD & Dr. Rayne Du ref. provider found

## 2015-05-24 NOTE — Anesthesia Postprocedure Evaluation (Signed)
Anesthesia Post Note  Patient: Robert Mack  Procedure(s) Performed: Procedure(s) (LRB): ENDOSCOPIC RETROGRADE CHOLANGIOPANCREATOGRAPHY (ERCP) (N/A) SPHINCTEROTOMY (N/A) BILIARY STENT PLACEMENT (N/A)  Patient location during evaluation: PACU Anesthesia Type: General Level of consciousness: awake and alert and oriented Pain management: pain level controlled Vital Signs Assessment: post-procedure vital signs reviewed and stable Respiratory status: spontaneous breathing Cardiovascular status: stable Postop Assessment: no signs of nausea or vomiting Anesthetic complications: no    Last Vitals:  Filed Vitals:   05/24/15 1430 05/24/15 1652  BP: 131/74 124/61  Pulse:  88  Temp:  36.6 C  Resp: 13 23    Last Pain: There were no vitals filed for this visit.               Pattijo Juste A

## 2015-05-24 NOTE — H&P (Signed)
Robert Mack is an 54 y.o. male.   Chief Complaint: Patient is here for diagnostic and therapeutic ERCP. HPI: Patient is 62 old Caucasian male who was present illness began about 2 weeks ago when he noted nausea. Last week he developed pruritus and urine color turn orange. He was noted to have abnormal LFTs. He was seen by Dr. Rory Percy. Ultrasound of upper abdomen was nondiagnostic. Patient was advised to stop simvastatin and aspirin. Over the weekend he noted that he was jaundiced. He had one episode of fleeting pain in right upper quadrant he has lost few pounds. He denies fever chills night sweats melena or rectal bleeding. Patient underwent abdominopelvic CT yesterday which reveals CBD measuring 20 mm with single calcified stone measuring 6.3 mm. Intrahepatic ducts were dilated. Is also question of soft tissue density in ampullary region. Patient therefore referred by Dr. Rory Percy for ERCP.  Past Medical History  Diagnosis Date  . HTN (hypertension)     borderline controlled  . HLD (hyperlipidemia)   . CAD (coronary artery disease)     status post coronary artery bypass grafting 2010 with normal LV function  . Atrial fibrillation (HCC)     postoperatively  . Sleep apnea   . Anxiety     Past Surgical History  Procedure Laterality Date  . Cardiac catheterization  2006, 2010    left heart  . Coronary artery bypass graft  2010    x3  . Cholecystectomy    . Cataract extraction Left 2014  . Lumbar fusion N/A 2016    Family History  Problem Relation Age of Onset  . Heart disease      premature cardiovascular disease, FH  . Coronary artery disease     Social History:  reports that he quit smoking about 26 years ago. He started smoking about 43 years ago. He has never used smokeless tobacco. He reports that he drinks alcohol. He reports that he does not use illicit drugs.  Allergies:  Allergies  Allergen Reactions  . Shellfish Allergy Anaphylaxis  . Baclofen Nausea And  Vomiting  . Tramadol Nausea And Vomiting    Medications Prior to Admission  Medication Sig Dispense Refill  . lisinopril (PRINIVIL,ZESTRIL) 10 MG tablet TAKE 1 TABLET BY MOUTH DAILY 90 tablet 3  . metoprolol (LOPRESSOR) 50 MG tablet TAKE ONE TABLET BY MOUTH TWICE DAILY 180 tablet 3  . simvastatin (ZOCOR) 20 MG tablet Take 20 mg by mouth every evening.    Marland Kitchen aspirin EC 81 MG tablet Take 1 tablet (81 mg total) by mouth daily.      Results for orders placed or performed during the hospital encounter of 05/24/15 (from the past 48 hour(s))  CBC     Status: None   Collection Time: 05/24/15  1:12 PM  Result Value Ref Range   WBC 6.8 4.0 - 10.5 K/uL   RBC 4.83 4.22 - 5.81 MIL/uL   Hemoglobin 15.6 13.0 - 17.0 g/dL   HCT 45.4 39.0 - 52.0 %   MCV 94.0 78.0 - 100.0 fL   MCH 32.3 26.0 - 34.0 pg   MCHC 34.4 30.0 - 36.0 g/dL   RDW 13.6 11.5 - 15.5 %   Platelets 206 150 - 400 K/uL  Protime-INR     Status: None   Collection Time: 05/24/15  1:12 PM  Result Value Ref Range   Prothrombin Time 13.0 11.6 - 15.2 seconds   INR 0.96 0.00 - 1.49   No results found.  ROS  Blood pressure 114/69, resp. rate 16, height 6\' 1"  (1.854 m), weight 247 lb (112.038 kg), SpO2 98 %. Physical Exam  Constitutional: He appears well-developed and well-nourished.  HENT:  Mouth/Throat: Oropharynx is clear and moist.  Eyes: Conjunctivae are normal. Scleral icterus is present.  Neck: No thyromegaly present.  Cardiovascular: Normal rate, regular rhythm and normal heart sounds.   No murmur heard. Respiratory: Effort normal and breath sounds normal.  GI:  Abdomen is full. On palpation it is soft and nontender without organomegaly or masses.  Musculoskeletal: He exhibits no edema.  Lymphadenopathy:    He has no cervical adenopathy.  Neurological: He is alert.  Skin: Skin is warm and dry.     Assessment/Plan Painless obstructive jaundice in a patient with status post cholecystectomy over 16 years ago. He has  markedly dilated biliary system with 6 mm calcified stone. There is question of soft tissue density at ampulla inserting for neoplastic process. Patient is agreeable to proceed with diagnostic/therapeutic ERCP. If he simply has common duct stone he will undergo biliary sphincterotomy and stone extraction and if he has ampullary mass obstructing the bile duct it will need to be stented. Patient's INR is 1.6. He will be given vitamin K 10 mg subcutaneous 1.  Shilah Hefel U 05/24/2015, 2:38 PM

## 2015-05-24 NOTE — Discharge Instructions (Signed)
Resume lisinopril and metoprolol as before. No aspirin for 3 days. Ondansetron 4 mg by mouth 3 times a day as needed for nausea. Hydrocodone/acetaminophen 3/325 one tablet every 6 when necessary pain. Clear liquids today and advance diet starting tomorrow morning. No driving for 24 hours. Will arrange for endoscopic ultrasound next week.  Endoscopic Retrograde Cholangiopancreatography (ERCP), Care After Refer to this sheet in the next few weeks. These instructions provide you with information on caring for yourself after your procedure. Your health care provider may also give you more specific instructions. Your treatment has been planned according to current medical practices, but problems sometimes occur. Call your health care provider if you have any problems or questions after your procedure.  WHAT TO EXPECT AFTER THE PROCEDURE  After your procedure, it is typical to feel:   Soreness in your throat.   Sick to your stomach (nauseous).   Bloated.  Dizzy.   Fatigued. HOME CARE INSTRUCTIONS  Have a friend or family member stay with you for the first 24 hours after your procedure.  Start taking your usual medicines and eating normally as soon as you feel well enough to do so or as directed by your health care provider. SEEK MEDICAL CARE IF:  You have abdominal pain.   You develop signs of infection, such as:   Chills.   Feeling unwell.  SEEK IMMEDIATE MEDICAL CARE IF:  You have difficulty swallowing.  You have worsening throat, chest, or abdominal pain.  You vomit.  You have bloody or very black stools.  You have a fever.   This information is not intended to replace advice given to you by your health care provider. Make sure you discuss any questions you have with your health care provider.   Document Released: 03/31/2013 Document Reviewed: 03/31/2013 Elsevier Interactive Patient Education [employer].

## 2015-05-25 ENCOUNTER — Encounter (HOSPITAL_COMMUNITY): Payer: Self-pay | Admitting: Internal Medicine

## 2015-05-25 ENCOUNTER — Telehealth: Payer: Self-pay | Admitting: Gastroenterology

## 2015-05-25 NOTE — Telephone Encounter (Signed)
I spoke with Dr. Laural Golden about Mr. Robert Mack.  Painless jaundice, dilated biliary tree without obvious mass on outside CT scan.  ERCP suggest distal CBD stricture that was stented.  [name], He needs upper EUS, radial +/- linear, next available EUS Thursday, he needs to bring a copy of his recent CT scan on disc with him to the EUS appt.  Thanks

## 2015-05-26 ENCOUNTER — Other Ambulatory Visit: Payer: Self-pay

## 2015-05-26 ENCOUNTER — Telehealth: Payer: Self-pay

## 2015-05-26 DIAGNOSIS — R945 Abnormal results of liver function studies: Principal | ICD-10-CM

## 2015-05-26 DIAGNOSIS — R7989 Other specified abnormal findings of blood chemistry: Secondary | ICD-10-CM

## 2015-05-26 NOTE — Telephone Encounter (Signed)
Appt cancelled and Dr. Ardis Hughs notified.

## 2015-05-26 NOTE — Telephone Encounter (Signed)
-----   Message from Worthy Keeler sent at 05/26/2015 11:14 AM EST ----- Regarding: RE: EUS Yes I spoke to his wife. ----- Message -----    From: Algernon Huxley, RN    Sent: 05/26/2015  10:50 AM      To: Worthy Keeler Subject: RE: EUS                                        Have you notified the pt?  ----- Message -----    From: Worthy Keeler    Sent: 05/26/2015  10:48 AM      To: Algernon Huxley, RN Subject: RE: EUS                                        Christie Beckers, Dr Laural Golden wants to cancel this appointment for Mr Ferretiz. Thank you for your assistance. Ann ----- Message -----    From: Algernon Huxley, RN    Sent: 05/26/2015   8:45 AM      To: Worthy Keeler Subject: RE: EUS                                        Nadine Counts has not been here all week. This EUS has been scheduled for 06/08/15 at [facility] at 10:45am. Pt will need to arrive at 9:15am and be NPO after midnight. I will contact the pt regarding the appt.  Thanks, Rosanne Sack RN ----- Message -----    From: Worthy Keeler    Sent: 05/26/2015   7:44 AM      To: Barron Alvine, CMA Subject: EUS                                            Hi [name] -- Dr Laural Golden wanted to see if Mr Ducker EUS will be scheduled next week, if not, please let him know -- he is at the hospital today -- he can be reached at [phone]. Thanks, Lelon Frohlich

## 2015-05-26 NOTE — Telephone Encounter (Signed)
Pt scheduled for EUS at [facility] 06/08/15@10 :45am. Pt to arrive there at 9:15am. Pt to be NPO after midnight. Pt aware of appt.

## 2015-05-29 ENCOUNTER — Telehealth (INDEPENDENT_AMBULATORY_CARE_PROVIDER_SITE_OTHER): Payer: Self-pay | Admitting: *Deleted

## 2015-05-29 DIAGNOSIS — R945 Abnormal results of liver function studies: Principal | ICD-10-CM

## 2015-05-29 DIAGNOSIS — R7989 Other specified abnormal findings of blood chemistry: Secondary | ICD-10-CM

## 2015-05-29 NOTE — Telephone Encounter (Signed)
Per Dr.Rehman the patient will need to have labs drawn , and will have them drawn at PCP office.

## 2015-05-31 ENCOUNTER — Encounter (INDEPENDENT_AMBULATORY_CARE_PROVIDER_SITE_OTHER): Payer: Self-pay

## 2015-06-01 ENCOUNTER — Encounter (INDEPENDENT_AMBULATORY_CARE_PROVIDER_SITE_OTHER): Payer: Self-pay | Admitting: *Deleted

## 2015-06-01 ENCOUNTER — Telehealth (INDEPENDENT_AMBULATORY_CARE_PROVIDER_SITE_OTHER): Payer: Self-pay | Admitting: *Deleted

## 2015-06-01 NOTE — Telephone Encounter (Signed)
She is asking for work note for next week they had procedure and next week seeing surgeon.  Work note given until he sees Psychologist, sport and exercise next   Start Monday December 12-December 16  They will know more on what the plan is once they see surgeon and will get notes from him unless plan changes   She will come Friday am to pick up here

## 2015-06-02 ENCOUNTER — Encounter (INDEPENDENT_AMBULATORY_CARE_PROVIDER_SITE_OTHER): Payer: Self-pay | Admitting: *Deleted

## 2015-06-08 ENCOUNTER — Ambulatory Visit (HOSPITAL_COMMUNITY): Admission: RE | Admit: 2015-06-08 | Payer: 59 | Source: Ambulatory Visit | Admitting: Gastroenterology

## 2015-06-08 ENCOUNTER — Encounter (HOSPITAL_COMMUNITY): Admission: RE | Payer: Self-pay | Source: Ambulatory Visit

## 2015-06-08 SURGERY — ESOPHAGEAL ENDOSCOPIC ULTRASOUND (EUS) RADIAL
Anesthesia: Monitor Anesthesia Care

## 2015-06-14 ENCOUNTER — Encounter: Payer: Self-pay | Admitting: Cardiovascular Disease

## 2015-06-14 ENCOUNTER — Ambulatory Visit (INDEPENDENT_AMBULATORY_CARE_PROVIDER_SITE_OTHER): Payer: 59 | Admitting: Cardiovascular Disease

## 2015-06-14 VITALS — BP 134/95 | HR 93 | Ht 73.0 in | Wt 235.0 lb

## 2015-06-14 DIAGNOSIS — E785 Hyperlipidemia, unspecified: Secondary | ICD-10-CM

## 2015-06-14 DIAGNOSIS — I25810 Atherosclerosis of coronary artery bypass graft(s) without angina pectoris: Secondary | ICD-10-CM

## 2015-06-14 DIAGNOSIS — I10 Essential (primary) hypertension: Secondary | ICD-10-CM | POA: Diagnosis not present

## 2015-06-14 DIAGNOSIS — I471 Supraventricular tachycardia: Secondary | ICD-10-CM

## 2015-06-14 MED ORDER — METOPROLOL SUCCINATE ER 25 MG PO TB24: 12.5000 mg | ORAL_TABLET | Freq: Every day | ORAL | Status: DC

## 2015-06-14 NOTE — Patient Instructions (Addendum)
   Continue Aspirin 81mg  daily  Begin Toprol XL 12.5mg  daily - will have to take 1/2 tab of 25mg  tablet - new sent to [facility] today.   Stop Lisinopril.    Stop Metoprolol tart (Lopressor).   Continue all other medications.   Your physician wants you to follow up in: 6 months.  You will receive a reminder letter in the mail one-two months in advance.  If you don't receive a letter, please call our office to schedule the follow up appointment

## 2015-06-14 NOTE — Progress Notes (Signed)
Patient ID: Robert Mack, male   DOB: [birth year], 54 y.o.   MRN: [id number]      SUBJECTIVE: The patient presents for routine cardiovascular follow-up. He has a history of coronary artery disease and CABG in 2010, essential hypertension, hyperlipidemia, PSVT, and sleep apnea. He also has a history of postoperative atrial fibrillation. He underwent a stress echocardiogram in April 2014 which was negative for inducible ischemia. He had normal left ventricular systolic function, exercised for 7 minutes, and achieved 10 METs.   He denies chest pain, palpitations, and shortness of breath.   He was diagnosed with hyperbilirubinemia and biliary obstruction. He has undergone ERCP twice , most recently at [facility] and had a biliary stent placed. There is some suspicion for pancreatic cancer. He recently underwent a liver biopsy and the results are pending. He reportedly had low normal blood pressures and lisinopril was stopped and metoprolol tartrate was switched to 12.5 mg daily of metoprolol succinate. Statin has been held due to elevated transaminases. He still takes aspirin 81 mg daily.   Review of Systems: As per "subjective", otherwise negative.  Allergies  Allergen Reactions  . Shellfish Allergy Anaphylaxis  . Baclofen Nausea And Vomiting  . Tramadol Nausea And Vomiting    Current Outpatient Prescriptions  Medication Sig Dispense Refill  . aspirin EC 81 MG tablet Take 81 mg by mouth daily.    . hydrOXYzine (ATARAX/VISTARIL) 25 MG tablet Take 25 mg by mouth 3 (three) times daily as needed.    Marland Kitchen lisinopril (PRINIVIL,ZESTRIL) 10 MG tablet TAKE 1 TABLET BY MOUTH DAILY 90 tablet 3  . metoprolol (LOPRESSOR) 50 MG tablet TAKE ONE TABLET BY MOUTH TWICE DAILY 180 tablet 3   No current facility-administered medications for this visit.    Past Medical History  Diagnosis Date  . HTN (hypertension)     borderline controlled  . HLD (hyperlipidemia)   . CAD (coronary artery disease)    status post coronary artery bypass grafting 2010 with normal LV function  . Atrial fibrillation (HCC)     postoperatively  . Sleep apnea   . Anxiety     Past Surgical History  Procedure Laterality Date  . Cardiac catheterization  2006, 2010    left heart  . Coronary artery bypass graft  2010    x3  . Cholecystectomy    . Cataract extraction Left 2014  . Lumbar fusion N/A 2016  . Ercp N/A 05/24/2015    Procedure: ENDOSCOPIC RETROGRADE CHOLANGIOPANCREATOGRAPHY (ERCP);  Surgeon: Rogene Houston, MD;  Location: [facility];  Service: Gastroenterology;  Laterality: N/A;  . Sphincterotomy N/A 05/24/2015    Procedure: SPHINCTEROTOMY;  Surgeon: Rogene Houston, MD;  Location: [facility];  Service: Gastroenterology;  Laterality: N/A;  . Biliary stent placement N/A 05/24/2015    Procedure: BILIARY STENT PLACEMENT;  Surgeon: Rogene Houston, MD;  Location: [facility];  Service: Gastroenterology;  Laterality: N/A;    Social History   Social History  . Marital Status: Married    Spouse Name: N/A  . Number of Children: N/A  . Years of Education: N/A   Occupational History  . Not on file.   Social History Main Topics  . Smoking status: Former Smoker -- 0.25 packs/day for 17 years    Types: Cigarettes    Start date: 06/25/1971    Quit date: 06/24/1988  . Smokeless tobacco: Never Used     Comment: quit 1990  . Alcohol Use: 0.0 oz/week    0 Standard drinks  or equivalent per week     Comment: Socially-beer  . Drug Use: No  . Sexual Activity: Not on file   Other Topics Concern  . Not on file   Social History Narrative     Filed Vitals:   06/14/15 1354  BP: 134/95  Pulse: 93  Height: 6\' 1"  (1.854 m)  Weight: 235 lb (106.595 kg)    PHYSICAL EXAM General: NAD HEENT: Jaundiced. Neck: No JVD, no thyromegaly. Lungs: Clear to auscultation bilaterally with normal respiratory effort. CV: Nondisplaced PMI.  Regular rate and mostly regular rhythm with premature contractions noted, normal  S1/S2, no [id number], soft 1/6 systolic murmur along LSB. No pretibial or periankle edema.  No carotid bruit.    Abdomen: Soft, no distention.  Neurologic: Alert and oriented x 3.  Psych: Normal affect. Skin: Jaundiced. Musculoskeletal: Normal range of motion, no gross deformities. Extremities: No clubbing or cyanosis.   ECG: Most recent ECG reviewed.      ASSESSMENT AND PLAN: 1. CAD/CABG: Symptomatically stable with normal LV systolic function. Normal stress echo in 09/2012. Continue ASA and metoprolol succinate 12.5 mg daily. Not on statin due to elevated transaminases earlier this month.  2. Essential HTN: Well controlled on current therapy. No changes. If elevated in the future, I may reinstitute lisinopril. He will check BP three times per week for the next month.  3. Hyperlipidemia:  Not on statin due to elevated transaminases earlier this month. Copy of lipids from PCP.  4. Sleep apnea: Treated with CPAP.  5. Palpitations/PSVT: Continue metoprolol succinate 12.5 mg daily.  Dispo: f/u 6 months.   Kate Sable, M.D., F.A.C.C.

## 2015-07-12 ENCOUNTER — Telehealth: Payer: Self-pay | Admitting: *Deleted

## 2015-07-12 NOTE — Telephone Encounter (Signed)
See BP logged scanned into EPIC.  All readings normal per Dr. Bronson Ing.  Patient notified to continue all medications the same.  Patient verbalized understanding.

## 2015-10-10 ENCOUNTER — Ambulatory Visit: Payer: 59 | Admitting: Cardiovascular Disease

## 2015-12-19 ENCOUNTER — Encounter: Payer: Self-pay | Admitting: Cardiovascular Disease

## 2015-12-19 ENCOUNTER — Ambulatory Visit (INDEPENDENT_AMBULATORY_CARE_PROVIDER_SITE_OTHER): Payer: 59 | Admitting: Cardiovascular Disease

## 2015-12-19 VITALS — BP 168/70 | HR 89 | Ht 72.0 in | Wt 247.0 lb

## 2015-12-19 DIAGNOSIS — I471 Supraventricular tachycardia: Secondary | ICD-10-CM

## 2015-12-19 DIAGNOSIS — I25810 Atherosclerosis of coronary artery bypass graft(s) without angina pectoris: Secondary | ICD-10-CM | POA: Diagnosis not present

## 2015-12-19 DIAGNOSIS — G4730 Sleep apnea, unspecified: Secondary | ICD-10-CM

## 2015-12-19 DIAGNOSIS — I4891 Unspecified atrial fibrillation: Secondary | ICD-10-CM | POA: Diagnosis not present

## 2015-12-19 DIAGNOSIS — I10 Essential (primary) hypertension: Secondary | ICD-10-CM | POA: Diagnosis not present

## 2015-12-19 DIAGNOSIS — E785 Hyperlipidemia, unspecified: Secondary | ICD-10-CM

## 2015-12-19 MED ORDER — METOPROLOL SUCCINATE ER 25 MG PO TB24: 12.5000 mg | ORAL_TABLET | Freq: Two times a day (BID) | ORAL | Status: DC

## 2015-12-19 NOTE — Progress Notes (Signed)
Patient ID: Robert Mack, male   DOB: May 20, 1961, 55 y.o.   MRN: [id number]      SUBJECTIVE: The patient presents for routine cardiovascular follow-up. He has a history of coronary artery disease and CABG in 2010, essential hypertension, hyperlipidemia, PSVT, and sleep apnea. He also has a history of postoperative atrial fibrillation. He underwent a stress echocardiogram in April 2014 which was negative for inducible ischemia. He had normal left ventricular systolic function, exercised for 7 minutes, and achieved 10 METs.   He denies chest pain, palpitations, and shortness of breath.   He was diagnosed with metastatic cholangiocarcinoma. He is undergoing chemotherapy. He will undergo a Whipple procedure at [facility] later this year and then undergo radiation therapy for metastases to the lungs.  Blood pressure 161/80 yesterday. PCP increase Toprol-XL to 12.5 mg twice daily.  Had been short of breath last week and was found to have hemoglobin 7.4, platelets 42.   Review of Systems: As per "subjective", otherwise negative.  Allergies  Allergen Reactions  . Shellfish Allergy Anaphylaxis  . Baclofen Nausea And Vomiting  . Tramadol Nausea And Vomiting    Current Outpatient Prescriptions  Medication Sig Dispense Refill  . aspirin EC 81 MG tablet Take 81 mg by mouth daily.    Marland Kitchen dexamethasone (DECADRON) 4 MG tablet Take 4 mg by mouth daily. 2 tablets daily    . docusate sodium (COLACE) 100 MG capsule Take 100 mg by mouth 2 (two) times daily.    Marland Kitchen LORazepam (ATIVAN) 2 MG tablet Take 2 mg by mouth every 6 (six) hours as needed for anxiety.    . metoprolol succinate (TOPROL XL) 25 MG 24 hr tablet Take 0.5 tablets (12.5 mg total) by mouth daily. 15 tablet 6   No current facility-administered medications for this visit.    Past Medical History  Diagnosis Date  . HTN (hypertension)     borderline controlled  . HLD (hyperlipidemia)   . CAD (coronary artery disease)     status post coronary  artery bypass grafting 2010 with normal LV function  . Atrial fibrillation (HCC)     postoperatively  . Sleep apnea   . Anxiety     Past Surgical History  Procedure Laterality Date  . Cardiac catheterization  2006, 2010    left heart  . Coronary artery bypass graft  2010    x3  . Cholecystectomy    . Cataract extraction Left 2014  . Lumbar fusion N/A 2016  . Ercp N/A 05/24/2015    Procedure: ENDOSCOPIC RETROGRADE CHOLANGIOPANCREATOGRAPHY (ERCP);  Surgeon: Rogene Houston, MD;  Location: [facility];  Service: Gastroenterology;  Laterality: N/A;  . Sphincterotomy N/A 05/24/2015    Procedure: SPHINCTEROTOMY;  Surgeon: Rogene Houston, MD;  Location: [facility];  Service: Gastroenterology;  Laterality: N/A;  . Biliary stent placement N/A 05/24/2015    Procedure: BILIARY STENT PLACEMENT;  Surgeon: Rogene Houston, MD;  Location: [facility];  Service: Gastroenterology;  Laterality: N/A;    Social History   Social History  . Marital Status: Married    Spouse Name: N/A  . Number of Children: N/A  . Years of Education: N/A   Occupational History  . Not on file.   Social History Main Topics  . Smoking status: Former Smoker -- 0.25 packs/day for 17 years    Types: Cigarettes    Start date: 06/25/1971    Quit date: 06/24/1988  . Smokeless tobacco: Never Used     Comment: quit 1990  .  Alcohol Use: 0.0 oz/week    0 Standard drinks or equivalent per week     Comment: Socially-beer  . Drug Use: No  . Sexual Activity: Not on file   Other Topics Concern  . Not on file   Social History Narrative     Filed Vitals:   12/19/15 1311  BP: 168/70  Pulse: 89  Height: 6' (1.829 m)  Weight: 247 lb (112.038 kg)  SpO2: 98%    PHYSICAL EXAM General: NAD HEENT: Normal. Neck: No JVD, no thyromegaly. Lungs: Clear to auscultation bilaterally with normal respiratory effort. CV: Nondisplaced PMI.  Regular rate and rhythm, normal S1/S2, no S3/S4, no murmur. No pretibial or periankle edema.       Abdomen: Soft, obese.  Neurologic: Alert and oriented.  Psych: Normal affect. Skin: Normal. Musculoskeletal: No gross deformities.    ECG: Most recent ECG reviewed.      ASSESSMENT AND PLAN: 1. CAD/CABG: Symptomatically stable with normal LV systolic function. Normal stress echo in 09/2012. Continue ASA and metoprolol succinate 12.5 mg twice daily. Not on statin due to elevated transaminases in the past, now with metastatic cholangiocarcinoma.  2. Essential HTN: Elevated today with increase today of Toprol-XL to 12.5 mg bid. Will monitor.  3. Hyperlipidemia: Not on statin due to elevated transaminases in the past, now with metastatic cholangiocarcinoma.  4. Sleep apnea: Treated with CPAP.  5. Palpitations/PSVT: Continue metoprolol succinate 12.5 mg twice daily.  Dispo: f/u 1 year.   Kate Sable, M.D., F.A.C.C.

## 2015-12-19 NOTE — Patient Instructions (Signed)

## 2016-12-19 ENCOUNTER — Encounter: Payer: Self-pay | Admitting: *Deleted

## 2016-12-20 ENCOUNTER — Encounter: Payer: Self-pay | Admitting: Cardiovascular Disease

## 2016-12-20 ENCOUNTER — Ambulatory Visit (INDEPENDENT_AMBULATORY_CARE_PROVIDER_SITE_OTHER): Payer: 59 | Admitting: Cardiovascular Disease

## 2016-12-20 VITALS — BP 142/92 | HR 66 | Ht 72.0 in | Wt 234.0 lb

## 2016-12-20 DIAGNOSIS — I471 Supraventricular tachycardia: Secondary | ICD-10-CM

## 2016-12-20 DIAGNOSIS — I4891 Unspecified atrial fibrillation: Secondary | ICD-10-CM

## 2016-12-20 DIAGNOSIS — I10 Essential (primary) hypertension: Secondary | ICD-10-CM | POA: Diagnosis not present

## 2016-12-20 DIAGNOSIS — I25708 Atherosclerosis of coronary artery bypass graft(s), unspecified, with other forms of angina pectoris: Secondary | ICD-10-CM

## 2016-12-20 DIAGNOSIS — E782 Mixed hyperlipidemia: Secondary | ICD-10-CM

## 2016-12-20 NOTE — Progress Notes (Signed)
SUBJECTIVE: The patient presents for routine cardiovascular follow-up. He has a history of coronary artery disease and CABG in 2010, essential hypertension, hyperlipidemia, PSVT, and sleep apnea. He also has a history of postoperative atrial fibrillation. He underwent a stress echocardiogram in April 2014 which was negative for inducible ischemia. He had normal left ventricular systolic function, exercised for 7 minutes, and achieved 10 METs.   He also has metastatic cholangiocarcinoma.  He is currently on a steroid regimen. He is no longer on chemotherapy. He tells me he has a right lung mass which has gotten slightly larger which is believed to be secondary to radiation pneumonitis.  He has some mild shortness of breath on occasion and occasional chest pressure. He is scheduled for a PET scan in July. He has been dealing with bilateral calf cramps.  He would like to begin an exercise regimen and plans to join the [facility] today.   Labs 11/20/16: Total cholesterol 126, triglycerides 109, HDL 29, LDL 75, nightly 12, creatinine 1.29, hemoglobin 15, platelets 145.  ECG performed in the office today which I ordered anf personally interpreted demonstrated normal sinus rhythm with no significant ischemic abnormalities.   Review of Systems: As per "subjective", otherwise negative.  Allergies  Allergen Reactions  . Shellfish Allergy Anaphylaxis  . Baclofen Nausea And Vomiting  . Tramadol Nausea And Vomiting    Current Outpatient Prescriptions  Medication Sig Dispense Refill  . aspirin EC 81 MG tablet Take 81 mg by mouth daily.    . metoprolol succinate (TOPROL XL) 25 MG 24 hr tablet Take 0.5 tablets (12.5 mg total) by mouth 2 (two) times daily. 60 tablet 6  . omeprazole (PRILOSEC) 20 MG capsule Take 20 mg by mouth daily.    . predniSONE (DELTASONE) 20 MG tablet Take 20 mg by mouth daily with breakfast.    . zolpidem (AMBIEN) 10 MG tablet Take 10 mg by mouth at bedtime as needed for  sleep.     No current facility-administered medications for this visit.     Past Medical History:  Diagnosis Date  . Anxiety   . Atrial fibrillation (HCC)    postoperatively  . CAD (coronary artery disease)    status post coronary artery bypass grafting 2010 with normal LV function  . HLD (hyperlipidemia)   . HTN (hypertension)    borderline controlled  . Sleep apnea     Past Surgical History:  Procedure Laterality Date  . BILIARY STENT PLACEMENT N/A 05/24/2015   Procedure: BILIARY STENT PLACEMENT;  Surgeon: Rogene Houston, MD;  Location: [facility];  Service: Gastroenterology;  Laterality: N/A;  . CARDIAC CATHETERIZATION  2006, 2010   left heart  . CATARACT EXTRACTION Left 2014  . CHOLECYSTECTOMY    . CORONARY ARTERY BYPASS GRAFT  2010   x3  . ERCP N/A 05/24/2015   Procedure: ENDOSCOPIC RETROGRADE CHOLANGIOPANCREATOGRAPHY (ERCP);  Surgeon: Rogene Houston, MD;  Location: [facility];  Service: Gastroenterology;  Laterality: N/A;  . LUMBAR FUSION N/A 2016  . SPHINCTEROTOMY N/A 05/24/2015   Procedure: SPHINCTEROTOMY;  Surgeon: Rogene Houston, MD;  Location: [facility];  Service: Gastroenterology;  Laterality: N/A;    Social History   Social History  . Marital status: Married    Spouse name: N/A  . Number of children: N/A  . Years of education: N/A   Occupational History  . Not on file.   Social History Main Topics  . Smoking status: Former Smoker    Packs/day: 0.25  Years: 17.00    Types: Cigarettes    Start date: 06/25/1971    Quit date: 06/24/1988  . Smokeless tobacco: Never Used     Comment: quit 1990  . Alcohol use 0.0 oz/week     Comment: Socially-beer  . Drug use: No  . Sexual activity: Not on file   Other Topics Concern  . Not on file   Social History Narrative  . No narrative on file     Vitals:   12/20/16 0810  BP: (!) 142/92  Pulse: 66  SpO2: 93%  Weight: 234 lb (106.1 kg)  Height: 6' (1.829 m)    Wt Readings from Last 3 Encounters:  12/20/16  234 lb (106.1 kg)  12/19/15 247 lb (112 kg)  06/14/15 235 lb (106.6 kg)     PHYSICAL EXAM General: NAD HEENT: Normal. Neck: No JVD, no thyromegaly. Lungs: Clear to auscultation bilaterally with normal respiratory effort. CV: Nondisplaced PMI.  Regular rate and rhythm, normal S1/S2, no S3/S4, no murmur. No pretibial or periankle edema.  No carotid bruit.   Abdomen: Soft, nontender, no distention.  Neurologic: Alert and oriented.  Psych: Normal affect. Skin: Normal. Musculoskeletal: No gross deformities.    ECG: Most recent ECG reviewed.   Labs:    Lipids: Lab Results  Component Value Date/Time   [facility]  08/10/2008 04:07 AM    67        Total Cholesterol/HDL:CHD Risk Coronary Heart Disease Risk Table                     Men   Women  1/2 Average Risk   3.4   3.3  Average Risk       5.0   4.4  2 X Average Risk   9.6   7.1  3 X Average Risk  23.4   11.0        Use the calculated Patient Ratio above and the CHD Risk Table to determine the patient's CHD Risk.        ATP III CLASSIFICATION (LDL):  <100     mg/dL   Optimal  100-129  mg/dL   Near or Above                    Optimal  130-159  mg/dL   Borderline  160-189  mg/dL   High  >190     mg/dL   Very High   CHOL  08/10/2008 04:07 AM    123        ATP III CLASSIFICATION:  <200     mg/dL   Desirable  200-239  mg/dL   Borderline High  >=240    mg/dL   High          TRIG 176 (H) 08/10/2008 04:07 AM   HDL 21 (L) 08/10/2008 04:07 AM       ASSESSMENT AND PLAN: 1. CAD/CABG: Stable ischemic heart disease. Normal stress echo in April 2014. Continue aspirin and beta blocker. Not on statin due to elevated transaminases in the past.  2. Essential HTN: Mildly elevated in the context of taking steroids. No changes to therapy.  3. Hyperlipidemia:  Not on statin due to elevated transaminases in the past. Lipids reviewed above in detail and overall well controlled.  4. Sleep apnea: Treated with CPAP.  5.  Palpitations/PSVT: Symptomatically stable on beta blocker. No changes.     Disposition: Follow up 1 yr  Kate Sable, M.D., F.A.C.C.

## 2016-12-20 NOTE — Patient Instructions (Signed)
Your physician wants you to follow-up in: 1 YEAR WITH DR KONESWARAN You will receive a reminder letter in the mail two months in advance. If you don't receive a letter, please call our office to schedule the follow-up appointment.  Your physician recommends that you continue on your current medications as directed. Please refer to the Current Medication list given to you today.  Thank you for choosing [facility]!!    

## 2017-06-10 MED ORDER — OXYCODONE HCL 5 MG PO TABS
10.00 mg | ORAL_TABLET | ORAL | Status: DC
Start: ? — End: 2017-06-10

## 2017-06-10 MED ORDER — SENNOSIDES-DOCUSATE SODIUM 8.6-50 MG PO TABS
1.00 | ORAL_TABLET | ORAL | Status: DC
Start: 2017-06-10 — End: 2017-06-10

## 2017-06-10 MED ORDER — METOPROLOL SUCCINATE ER 25 MG PO TB24
25.00 mg | ORAL_TABLET | ORAL | Status: DC
Start: 2017-06-11 — End: 2017-06-10

## 2017-06-10 MED ORDER — ONDANSETRON 4 MG PO TBDP
4.00 mg | ORAL_TABLET | ORAL | Status: DC
Start: ? — End: 2017-06-10

## 2017-06-10 MED ORDER — ASPIRIN EC 81 MG PO TBEC
81.00 mg | DELAYED_RELEASE_TABLET | ORAL | Status: DC
Start: 2017-06-11 — End: 2017-06-10

## 2017-06-10 MED ORDER — SORBITOL 70 % RE SOLN
30.00 mL | RECTAL | Status: DC
Start: ? — End: 2017-06-10

## 2017-06-10 MED ORDER — DIPHENHYDRAMINE HCL 25 MG PO CAPS
25.00 mg | ORAL_CAPSULE | ORAL | Status: DC
Start: ? — End: 2017-06-10

## 2017-06-10 MED ORDER — OXYCODONE HCL 5 MG PO TABS
5.00 mg | ORAL_TABLET | ORAL | Status: DC
Start: ? — End: 2017-06-10

## 2017-06-10 MED ORDER — NALBUPHINE HCL 10 MG/ML IJ SOLN
2.50 mg | INTRAMUSCULAR | Status: DC
Start: ? — End: 2017-06-10

## 2017-06-10 MED ORDER — ENOXAPARIN SODIUM 40 MG/0.4ML ~~LOC~~ SOLN
40.00 mg | SUBCUTANEOUS | Status: DC
Start: 2017-06-11 — End: 2017-06-10

## 2017-06-10 MED ORDER — POLYETHYLENE GLYCOL 3350 17 G PO PACK
17.00 g | PACK | ORAL | Status: DC
Start: 2017-06-11 — End: 2017-06-10

## 2017-06-10 MED ORDER — ACETAMINOPHEN 500 MG PO TABS
1000.00 mg | ORAL_TABLET | ORAL | Status: DC
Start: ? — End: 2017-06-10

## 2017-06-10 MED ORDER — BISACODYL 10 MG RE SUPP
10.00 mg | RECTAL | Status: DC
Start: ? — End: 2017-06-10

## 2017-06-10 MED ORDER — PANTOPRAZOLE SODIUM 40 MG PO TBEC
40.00 mg | DELAYED_RELEASE_TABLET | ORAL | Status: DC
Start: 2017-06-11 — End: 2017-06-10

## 2017-06-10 MED ORDER — RANITIDINE HCL 150 MG PO TABS
150.00 mg | ORAL_TABLET | ORAL | Status: DC
Start: ? — End: 2017-06-10

## 2017-11-27 ENCOUNTER — Observation Stay (HOSPITAL_COMMUNITY)
Admission: EM | Admit: 2017-11-27 | Discharge: 2017-11-28 | Disposition: A | Discharge status: Home / Self Care | Payer: 59 | Attending: Internal Medicine | Admitting: Internal Medicine

## 2017-11-27 ENCOUNTER — Encounter (HOSPITAL_COMMUNITY): Payer: Self-pay

## 2017-11-27 ENCOUNTER — Other Ambulatory Visit: Payer: Self-pay

## 2017-11-27 ENCOUNTER — Emergency Department (HOSPITAL_COMMUNITY): Payer: 59

## 2017-11-27 DIAGNOSIS — G8929 Other chronic pain: Secondary | ICD-10-CM | POA: Diagnosis not present

## 2017-11-27 DIAGNOSIS — C221 Intrahepatic bile duct carcinoma: Secondary | ICD-10-CM | POA: Diagnosis not present

## 2017-11-27 DIAGNOSIS — G43A1 Cyclical vomiting, intractable: Secondary | ICD-10-CM | POA: Diagnosis not present

## 2017-11-27 DIAGNOSIS — I951 Orthostatic hypotension: Secondary | ICD-10-CM | POA: Diagnosis not present

## 2017-11-27 DIAGNOSIS — Z9221 Personal history of antineoplastic chemotherapy: Secondary | ICD-10-CM | POA: Diagnosis not present

## 2017-11-27 DIAGNOSIS — Z7982 Long term (current) use of aspirin: Secondary | ICD-10-CM | POA: Insufficient documentation

## 2017-11-27 DIAGNOSIS — R112 Nausea with vomiting, unspecified: Principal | ICD-10-CM | POA: Diagnosis present

## 2017-11-27 DIAGNOSIS — I4891 Unspecified atrial fibrillation: Secondary | ICD-10-CM | POA: Diagnosis not present

## 2017-11-27 DIAGNOSIS — Z79899 Other long term (current) drug therapy: Secondary | ICD-10-CM | POA: Insufficient documentation

## 2017-11-27 DIAGNOSIS — I2510 Atherosclerotic heart disease of native coronary artery without angina pectoris: Secondary | ICD-10-CM | POA: Insufficient documentation

## 2017-11-27 DIAGNOSIS — Z87891 Personal history of nicotine dependence: Secondary | ICD-10-CM | POA: Diagnosis not present

## 2017-11-27 DIAGNOSIS — I25810 Atherosclerosis of coronary artery bypass graft(s) without angina pectoris: Secondary | ICD-10-CM | POA: Diagnosis present

## 2017-11-27 DIAGNOSIS — I10 Essential (primary) hypertension: Secondary | ICD-10-CM | POA: Diagnosis not present

## 2017-11-27 LAB — COMPREHENSIVE METABOLIC PANEL
ALBUMIN: 3.7 g/dL (ref 3.5–5.0)
ALT: 21 U/L (ref 17–63)
ANION GAP: 9 (ref 5–15)
AST: 18 U/L (ref 15–41)
Alkaline Phosphatase: 108 U/L (ref 38–126)
BUN: 36 mg/dL — ABNORMAL HIGH (ref 6–20)
CHLORIDE: 103 mmol/L (ref 101–111)
CO2: 23 mmol/L (ref 22–32)
Calcium: 7.4 mg/dL — ABNORMAL LOW (ref 8.9–10.3)
Creatinine, Ser: 1.06 mg/dL (ref 0.61–1.24)
GFR calc Af Amer: >60 mL/min (ref >60)
GFR calc non Af Amer: >60 mL/min (ref >60)
Glucose, Bld: 123 mg/dL — ABNORMAL HIGH (ref 65–99)
POTASSIUM: 3.6 mmol/L (ref 3.5–5.1)
SODIUM: 135 mmol/L (ref 135–145)
Total Bilirubin: 1.9 mg/dL — ABNORMAL HIGH (ref 0.3–1.2)
Total Protein: 7 g/dL (ref 6.5–8.1)

## 2017-11-27 LAB — CBC WITH DIFFERENTIAL/PLATELET
BASOS PCT: 0 %
Basophils Absolute: 0 10*3/uL (ref 0.0–0.1)
Eosinophils Absolute: 0 10*3/uL (ref 0.0–0.7)
Eosinophils Relative: 0 %
HEMATOCRIT: 36.8 % — AB (ref 39.0–52.0)
HEMOGLOBIN: 12 g/dL — AB (ref 13.0–17.0)
LYMPHS ABS: 0.5 10*3/uL — AB (ref 0.7–4.0)
LYMPHS PCT: 9 %
MCH: 30.5 pg (ref 26.0–34.0)
MCHC: 32.6 g/dL (ref 30.0–36.0)
MCV: 93.6 fL (ref 78.0–100.0)
MONOS PCT: 7 %
Monocytes Absolute: 0.3 10*3/uL (ref 0.1–1.0)
NEUTROS ABS: 4.5 10*3/uL (ref 1.7–7.7)
NEUTROS PCT: 84 %
Platelets: 210 10*3/uL (ref 150–400)
RBC: 3.93 MIL/uL — ABNORMAL LOW (ref 4.22–5.81)
RDW: 15.8 % — ABNORMAL HIGH (ref 11.5–15.5)
WBC: 5.3 10*3/uL (ref 4.0–10.5)

## 2017-11-27 LAB — LIPASE, BLOOD: Lipase: 18 U/L (ref 11–51)

## 2017-11-27 MED ORDER — SODIUM CHLORIDE 0.9 % IV SOLN
INTRAVENOUS | Status: DC
  Administered 2017-11-27 – 2017-11-28 (×2): via INTRAVENOUS

## 2017-11-27 MED ORDER — ONDANSETRON HCL 4 MG PO TABS: 4.0000 mg | ORAL_TABLET | Freq: Four times a day (QID) | ORAL | Status: DC | PRN

## 2017-11-27 MED ORDER — ACETAMINOPHEN 325 MG PO TABS: 650.0000 mg | ORAL_TABLET | Freq: Four times a day (QID) | ORAL | Status: DC | PRN

## 2017-11-27 MED ORDER — ACETAMINOPHEN 650 MG RE SUPP: 650.0000 mg | Freq: Four times a day (QID) | RECTAL | Status: DC | PRN

## 2017-11-27 MED ORDER — ENSURE ENLIVE PO LIQD
237.0000 mL | Freq: Two times a day (BID) | ORAL | Status: DC
  Administered 2017-11-28: 237 mL via ORAL

## 2017-11-27 MED ORDER — SODIUM CHLORIDE 0.9 % IV BOLUS
1000.0000 mL | Freq: Once | INTRAVENOUS | Status: AC
Start: 2017-11-27 — End: 2017-11-27
  Administered 2017-11-27: 1000 mL via INTRAVENOUS

## 2017-11-27 MED ORDER — HYDROMORPHONE HCL 1 MG/ML IJ SOLN
1.0000 mg | INTRAMUSCULAR | Status: DC | PRN
  Administered 2017-11-27: 1 mg via INTRAVENOUS
  Filled 2017-11-27: qty 1

## 2017-11-27 MED ORDER — FAMOTIDINE IN NACL 20-0.9 MG/50ML-% IV SOLN
20.0000 mg | Freq: Once | INTRAVENOUS | Status: AC
  Administered 2017-11-27: 20 mg via INTRAVENOUS
  Filled 2017-11-27: qty 50

## 2017-11-27 MED ORDER — ONDANSETRON HCL 4 MG/2ML IJ SOLN: 4.0000 mg | Freq: Four times a day (QID) | INTRAMUSCULAR | Status: DC | PRN

## 2017-11-27 MED ORDER — ASPIRIN EC 81 MG PO TBEC
81.0000 mg | DELAYED_RELEASE_TABLET | Freq: Every day | ORAL | Status: DC
Start: 2017-11-27 — End: 2017-11-28
  Administered 2017-11-27: 81 mg via ORAL
  Filled 2017-11-27: qty 1

## 2017-11-27 MED ORDER — METOPROLOL SUCCINATE ER 25 MG PO TB24
12.5000 mg | ORAL_TABLET | Freq: Two times a day (BID) | ORAL | Status: DC
  Administered 2017-11-27: 12.5 mg via ORAL
  Filled 2017-11-27 (×2): qty 1

## 2017-11-27 MED ORDER — PANTOPRAZOLE SODIUM 40 MG PO TBEC
40.0000 mg | DELAYED_RELEASE_TABLET | Freq: Every day | ORAL | Status: DC
Start: 2017-11-27 — End: 2017-11-28
  Administered 2017-11-27: 40 mg via ORAL
  Filled 2017-11-27 (×2): qty 1

## 2017-11-27 MED ORDER — ENOXAPARIN SODIUM 40 MG/0.4ML ~~LOC~~ SOLN
40.0000 mg | SUBCUTANEOUS | Status: DC
  Filled 2017-11-27: qty 0.4

## 2017-11-27 MED ORDER — DIPHENHYDRAMINE HCL 50 MG/ML IJ SOLN
25.0000 mg | Freq: Once | INTRAMUSCULAR | Status: AC
  Administered 2017-11-27: 25 mg via INTRAVENOUS
  Filled 2017-11-27: qty 1

## 2017-11-27 MED ORDER — ZOLPIDEM TARTRATE 5 MG PO TABS
10.0000 mg | ORAL_TABLET | Freq: Every evening | ORAL | Status: DC | PRN
  Administered 2017-11-27: 10 mg via ORAL
  Filled 2017-11-27: qty 2

## 2017-11-27 MED ORDER — ONDANSETRON HCL 4 MG/2ML IJ SOLN
4.0000 mg | INTRAMUSCULAR | Status: AC | PRN
  Administered 2017-11-27 (×2): 4 mg via INTRAVENOUS
  Filled 2017-11-27 (×2): qty 2

## 2017-11-27 MED ORDER — PROMETHAZINE HCL 25 MG/ML IJ SOLN
12.5000 mg | Freq: Once | INTRAMUSCULAR | Status: AC
  Administered 2017-11-27: 12.5 mg via INTRAVENOUS
  Filled 2017-11-27: qty 1

## 2017-11-27 MED ORDER — LORAZEPAM 0.5 MG PO TABS: 0.5000 mg | ORAL_TABLET | Freq: Four times a day (QID) | ORAL | Status: DC | PRN

## 2017-11-27 MED ORDER — SODIUM CHLORIDE 0.9 % IV BOLUS
1000.0000 mL | Freq: Once | INTRAVENOUS | Status: AC
  Administered 2017-11-27: 1000 mL via INTRAVENOUS

## 2017-11-27 MED ORDER — OXYCODONE HCL ER 10 MG PO T12A
10.0000 mg | EXTENDED_RELEASE_TABLET | Freq: Two times a day (BID) | ORAL | Status: DC
  Administered 2017-11-27 – 2017-11-28 (×2): 10 mg via ORAL
  Filled 2017-11-27 (×2): qty 1

## 2017-11-27 NOTE — H&P (Signed)
TRH H&P    Patient Demographics:    Robert Mack, is a 57 y.o. male  MRN: [id number]  DOB - October 08, 1960  Admit Date - 11/27/2017  Referring MD/NP/PA: Dr. Thurnell Garbe  Outpatient Primary MD for the patient is Rory Percy, MD  Patient coming from: Home  Chief complaint-nausea and vomiting   HPI:    Robert Mack  is a 57 y.o. male, with history of CAD status post CABG in 2010, essential hypertension, hyperlipidemia, SVT, sleep apnea, metastatic glandular carcinoma, with mets to right lung, on  Chemotherapy, who recently underwent chemotherapy with oxaliplatin, 5-FU patient started.  Vomiting and multiple episodes of vomiting after chemotherapy.  He also had intractable nausea.  Patient feels much better at this time.  He did drink ginger ale.  He denies diarrhea.  No abdominal pain.  Usually has chills.  Denies any fever. Denies chest pain or shortness of breath.  Denies headache or blurred vision. Denies dysuria    Review of systems:      All other systems reviewed and are negative.   With Past History of the following :    Past Medical History:  Diagnosis Date  . Anxiety   . Atrial fibrillation (HCC)    postoperatively  . CAD (coronary artery disease)    status post coronary artery bypass grafting 2010 with normal LV function  . HLD (hyperlipidemia)   . HTN (hypertension)    borderline controlled  . Sleep apnea       Past Surgical History:  Procedure Laterality Date  . BILIARY STENT PLACEMENT N/A 05/24/2015   Procedure: BILIARY STENT PLACEMENT;  Surgeon: Rogene Houston, MD;  Location: [facility];  Service: Gastroenterology;  Laterality: N/A;  . CARDIAC CATHETERIZATION  2006, 2010   left heart  . CATARACT EXTRACTION Left 2014  . CHOLECYSTECTOMY    . CORONARY ARTERY BYPASS GRAFT  2010   x3  . ERCP N/A 05/24/2015   Procedure: ENDOSCOPIC RETROGRADE CHOLANGIOPANCREATOGRAPHY (ERCP);  Surgeon: Rogene Houston, MD;  Location: [facility];  Service: Gastroenterology;  Laterality: N/A;  . LUMBAR FUSION N/A 2016  . SPHINCTEROTOMY N/A 05/24/2015   Procedure: SPHINCTEROTOMY;  Surgeon: Rogene Houston, MD;  Location: [facility];  Service: Gastroenterology;  Laterality: N/A;      Social History:      Social History   Tobacco Use  . Smoking status: Former Smoker    Packs/day: 0.25    Years: 17.00    Pack years: 4.25    Types: Cigarettes    Start date: 06/25/1971    Last attempt to quit: 06/24/1988    Years since quitting: 29.4  . Smokeless tobacco: Never Used  . Tobacco comment: quit 1990  Substance Use Topics  . Alcohol use: Yes    Alcohol/week: 0.0 oz    Comment: Socially-beer       Family History :     Family History  Problem Relation Age of Onset  . Heart disease Unknown        premature cardiovascular disease, FH  . Coronary  artery disease Unknown       Home Medications:   Prior to Admission medications   Medication Sig Start Date End Date Taking? Authorizing Provider  aspirin EC 81 MG tablet Take 81 mg by mouth daily.   Yes [provider]  dexamethasone (DECADRON) 4 MG tablet Take 2 tablets by mouth daily as needed. On day 2 and 3 after chemotherapy 11/05/17  Yes [provider]  LORazepam (ATIVAN) 0.5 MG tablet Take 0.5 mg by mouth every 6 (six) hours as needed. for nausea 11/05/17  Yes [provider]  methocarbamol (ROBAXIN) 500 MG tablet Take 500 mg by mouth 4 (four) times daily. 11/24/17  Yes [provider]  metoprolol succinate (TOPROL XL) 25 MG 24 hr tablet Take 0.5 tablets (12.5 mg total) by mouth 2 (two) times daily. 12/19/15  Yes [name], MD  omeprazole (PRILOSEC) 20 MG capsule Take 20 mg by mouth daily.   Yes [provider]  ondansetron (ZOFRAN) 8 MG tablet Take 8 mg by mouth every 8 (eight) hours as needed. 08/25/17  Yes [provider]  oxyCODONE (OXY IR/ROXICODONE) 5 MG immediate release tablet Take  1-2 tablets by mouth every 4 (four) hours as needed. 11/24/17  Yes [provider]  OXYCONTIN 10 MG 12 hr tablet Take 1 tablet by mouth 2 (two) times daily. 11/24/17  Yes [provider]  prochlorperazine (COMPAZINE) 5 MG tablet Take 5 mg by mouth every 6 (six) hours as needed. for nausea 08/25/17  Yes [provider]  zolpidem (AMBIEN) 10 MG tablet Take 10 mg by mouth at bedtime as needed for sleep.   Yes [provider]     Allergies:     Allergies  Allergen Reactions  . Shellfish Allergy Anaphylaxis and Swelling  . Baclofen Nausea And Vomiting  . Tramadol Nausea And Vomiting     Physical Exam:   Vitals  Blood pressure (!) 143/94, pulse 81, temperature 97.9 F (36.6 C), temperature source Oral, resp. rate 12, height 6' (1.829 m), weight 96.6 kg (213 lb), SpO2 100 %.  1.  General: Appears in no acute distress  2. Psychiatric:  Intact judgement and  insight, awake alert, oriented x 3.  3. Neurologic: No focal neurological deficits, all cranial nerves intact.Strength 5/5 all 4 extremities, sensation intact all 4 extremities, plantars down going.  4. Eyes :  anicteric sclerae, moist conjunctivae with no lid lag. PERRLA.  5. ENMT:  Oropharynx clear with moist mucous membranes and good dentition  6. Neck:  supple, no cervical lymphadenopathy appriciated, No thyromegaly  7. Respiratory : Normal respiratory effort, good air movement bilaterally,clear to  auscultation bilaterally  8. Cardiovascular : RRR, no gallops, rubs or murmurs, no leg edema  9. Gastrointestinal:  Positive bowel sounds, abdomen soft, non-tender to palpation,no hepatosplenomegaly, no rigidity or guarding       10. Skin:  No cyanosis, normal texture and turgor, no rash, lesions or ulcers  11.Musculoskeletal:  Good muscle tone,  joints appear normal , no effusions,  normal range of motion    Data Review:    CBC Recent Labs  Lab 11/27/17 1235  WBC 5.3  HGB 12.0*    HCT 36.8*  PLT 210  MCV 93.6  MCH 30.5  MCHC 32.6  RDW 15.8*  LYMPHSABS 0.5*  MONOABS 0.3  EOSABS 0.0  BASOSABS 0.0   ------------------------------------------------------------------------------------------------------------------  Chemistries  Recent Labs  Lab 11/27/17 1235  NA 135  K 3.6  CL 103  CO2  23  GLUCOSE 123*  BUN 36*  CREATININE 1.06  CALCIUM 7.4*  AST 18  ALT 21  ALKPHOS 108  BILITOT 1.9*   ------------------------------------------------------------------------------------------------------------------  ------------------------------------------------------------------------------------------------------------------ GFR: Estimated Creatinine Clearance: 90.4 mL/min (by C-G formula based on SCr of 1.06 mg/dL). Liver Function Tests: Recent Labs  Lab 11/27/17 1235  AST 18  ALT 21  ALKPHOS 108  BILITOT 1.9*  PROT 7.0  ALBUMIN 3.7   Recent Labs  Lab 11/27/17 1235  LIPASE 18    --------------------------------------------------------------------------------------------------------------- Urine analysis: No results found for: COLORURINE, APPEARANCEUR, LABSPEC, PHURINE, GLUCOSEU, HGBUR, BILIRUBINUR, KETONESUR, PROTEINUR, UROBILINOGEN, NITRITE, LEUKOCYTESUR    Imaging Results:    Dg Abd Acute W/chest  Result Date: 11/27/2017 CLINICAL DATA:  Nausea and vomiting, metastatic cancer of the gallbladder EXAM: DG ABDOMEN ACUTE W/ 1V CHEST COMPARISON:  Chest radiograph 08/08/2016 FINDINGS: RIGHT jugular portacath with tip over SVC. Normal heart size post mediastinum. Mediastinal contours and pulmonary vascularity normal. Complete atelectasis of RIGHT middle lobe. Small RIGHT pleural effusion. LEFT lung clear. No pneumothorax. Nonobstructive bowel gas pattern. No bowel dilatation, bowel wall thickening or free air. Stool in rectum. Bones intact with postsurgical changes of lower lumbar fusion. IMPRESSION: Complete atelectasis RIGHT middle lobe with small  RIGHT pleural effusion. No acute abdominal findings. Electronically Signed   By: Lavonia Dana M.D.   On: 11/27/2017 13:50      Assessment & Plan:    Active Problems:   CAD, ARTERY BYPASS GRAFT   HTN (hypertension)   Intractable nausea and vomiting   1. Intractable nausea and vomiting-chemotherapy-induced, patient is slowly improving wants to try soft diet.  Will order soft diet and Zofran for as needed nausea and vomiting.  If patient starts vomiting again will make him n.p.o. 2. Metastatic cholangiocarcinoma-patient is on chemotherapy, followed by [facility] oncology. 3. Chronic pain-cancer related, continue OxyContin 10 mg p.o. twice daily.  Will start Dilaudid 1 mg every 4 hours as needed for breakthrough pain. 4. Hypertension-blood pressure stable, continue metoprolol 5. CAD status post CABG-stable, no chest pain, continue aspirin, metoprolol    DVT Prophylaxis-   Lovenox   AM Labs Ordered, also please review Full Orders  Family Communication: Admission, patients condition and plan of care including tests being ordered have been discussed with the patient and his wife at bedside who indicate understanding and agree with the plan and Code Status.  Code Status: DNR  Admission status: Observation  Time spent in minutes : 60 minutes   Oswald Hillock M.D on 11/27/2017 at 4:22 PM  Between 7am to 7pm - Pager - [phone]. After 7pm go to [email or web address] - password [facility]  [facility] - Office  [phone]

## 2017-11-27 NOTE — ED Triage Notes (Signed)
Pt had chemo on Monday and is now vomiting. Has thrown up a lot in the last 24 hours. Pt has port a cath. Pt had 2 zofran pills at 8 and compazine and 9. Pt is throwing up Bile.

## 2017-11-27 NOTE — ED Provider Notes (Signed)
[facility] EMERGENCY DEPARTMENT Provider Note   CSN: [id number] Arrival date & time: 11/27/17  1209     History   Chief Complaint No chief complaint on file.   HPI Robert Mack is a 57 y.o. male.  HPI  Pt was seen at 1230.  Per pt and his wife, c/o gradual onset and persistence of multiple intermittent episodes of N/V that began 2 days ago.  LD chemo 3 days ago. Pt has been taking zofran, compazine and ativan at home without improvement of his symptoms. Denies abd pain, no CP/SOB, no back pain, no fevers, no black or blood in stools or emesis.    Past Medical History:  Diagnosis Date  . Anxiety   . Atrial fibrillation (HCC)    postoperatively  . CAD (coronary artery disease)    status post coronary artery bypass grafting 2010 with normal LV function  . HLD (hyperlipidemia)   . HTN (hypertension)    borderline controlled  . Sleep apnea     Patient Active Problem List   Diagnosis Date Noted  . CAD (coronary artery disease)   . HLD (hyperlipidemia)   . HTN (hypertension)   . PANIC DISORDER WITH AGORAPHOBIA 02/07/2009  . HYPERCHOLESTEROLEMIA  IIA 02/06/2009  . CAD, ARTERY BYPASS GRAFT 02/06/2009  . ATRIAL FIBRILLATION 02/06/2009    Past Surgical History:  Procedure Laterality Date  . BILIARY STENT PLACEMENT N/A 05/24/2015   Procedure: BILIARY STENT PLACEMENT;  Surgeon: Rogene Houston, MD;  Location: [facility];  Service: Gastroenterology;  Laterality: N/A;  . CARDIAC CATHETERIZATION  2006, 2010   left heart  . CATARACT EXTRACTION Left 2014  . CHOLECYSTECTOMY    . CORONARY ARTERY BYPASS GRAFT  2010   x3  . ERCP N/A 05/24/2015   Procedure: ENDOSCOPIC RETROGRADE CHOLANGIOPANCREATOGRAPHY (ERCP);  Surgeon: Rogene Houston, MD;  Location: [facility];  Service: Gastroenterology;  Laterality: N/A;  . LUMBAR FUSION N/A 2016  . SPHINCTEROTOMY N/A 05/24/2015   Procedure: SPHINCTEROTOMY;  Surgeon: Rogene Houston, MD;  Location: [facility];  Service: Gastroenterology;  Laterality:  N/A;        Home Medications    Prior to Admission medications   Medication Sig Start Date End Date Taking? Authorizing Provider  aspirin EC 81 MG tablet Take 81 mg by mouth daily.   Yes [provider]  dexamethasone (DECADRON) 4 MG tablet Take 2 tablets by mouth daily as needed. On day 2 and 3 after chemotherapy 11/05/17  Yes [provider]  LORazepam (ATIVAN) 0.5 MG tablet Take 0.5 mg by mouth every 6 (six) hours as needed. for nausea 11/05/17  Yes [provider]  methocarbamol (ROBAXIN) 500 MG tablet Take 500 mg by mouth 4 (four) times daily. 11/24/17  Yes [provider]  metoprolol succinate (TOPROL XL) 25 MG 24 hr tablet Take 0.5 tablets (12.5 mg total) by mouth 2 (two) times daily. 12/19/15  Yes [name], MD  omeprazole (PRILOSEC) 20 MG capsule Take 20 mg by mouth daily.   Yes [provider]  ondansetron (ZOFRAN) 8 MG tablet Take 8 mg by mouth every 8 (eight) hours as needed. 08/25/17  Yes [provider]  oxyCODONE (OXY IR/ROXICODONE) 5 MG immediate release tablet Take 1-2 tablets by mouth every 4 (four) hours as needed. 11/24/17  Yes [provider]  OXYCONTIN 10 MG 12 hr tablet Take 1 tablet by mouth 2 (two) times daily. 11/24/17  Yes [provider]  prochlorperazine (COMPAZINE) 5 MG tablet  Take 5 mg by mouth every 6 (six) hours as needed. for nausea 08/25/17  Yes [provider]  zolpidem (AMBIEN) 10 MG tablet Take 10 mg by mouth at bedtime as needed for sleep.   Yes [provider]    Family History Family History  Problem Relation Age of Onset  . Heart disease Unknown        premature cardiovascular disease, FH  . Coronary artery disease Unknown     Social History Social History   Tobacco Use  . Smoking status: Former Smoker    Packs/day: 0.25    Years: 17.00    Pack years: 4.25    Types: Cigarettes    Start date: 06/25/1971    Last attempt to quit: 06/24/1988    Years  since quitting: 29.4  . Smokeless tobacco: Never Used  . Tobacco comment: quit 1990  Substance Use Topics  . Alcohol use: Yes    Alcohol/week: 0.0 oz    Comment: Socially-beer  . Drug use: No     Allergies   Shellfish allergy; Baclofen; and Tramadol   Review of Systems Review of Systems ROS: Statement: All systems negative except as marked or noted in the HPI; Constitutional: Negative for fever and chills. ; ; Eyes: Negative for eye pain, redness and discharge. ; ; ENMT: Negative for ear pain, hoarseness, nasal congestion, sinus pressure and sore throat. ; ; Cardiovascular: Negative for chest pain, palpitations, diaphoresis, dyspnea and peripheral edema. ; ; Respiratory: Negative for cough, wheezing and stridor. ; ; Gastrointestinal: +N/V. Negative for diarrhea, abdominal pain, blood in stool, hematemesis, jaundice and rectal bleeding. . ; ; Genitourinary: Negative for dysuria, flank pain and hematuria. ; ; Musculoskeletal: Negative for back pain and neck pain. Negative for swelling and trauma.; ; Skin: Negative for pruritus, rash, abrasions, blisters, bruising and skin lesion.; ; Neuro: Negative for headache, lightheadedness and neck stiffness. Negative for weakness, altered level of consciousness, altered mental status, extremity weakness, paresthesias, involuntary movement, seizure and syncope.       Physical Exam Updated Vital Signs BP (!) 143/94 (BP Location: Right Arm)   Pulse 81   Temp 97.9 F (36.6 C) (Oral)   Resp 12   Ht 6' (1.829 m)   Wt 96.6 kg (213 lb)   SpO2 100%   BMI 28.89 kg/m    12:44 Orthostatic Vital Signs VB  Orthostatic Lying   BP- Lying: 124/88   Pulse- Lying: 86       Orthostatic Sitting  BP- Sitting: 124/82   Pulse- Sitting: 88       Orthostatic Standing at 0 minutes  BP- Standing at 0 minutes: 104/80   Pulse- Standing at 0 minutes: 111     Physical Exam 1235: Physical examination:  Nursing notes reviewed; Vital signs and O2 SAT reviewed;   Constitutional: Well developed, Well nourished, In no acute distress; Head:  Normocephalic, atraumatic; Eyes: EOMI, PERRL, No scleral icterus; ENMT: Mouth and pharynx normal, Mucous membranes dry; Neck: Supple, Full range of motion, No lymphadenopathy; Cardiovascular: Regular rate and rhythm, No gallop; Respiratory: Breath sounds clear & equal bilaterally, No wheezes.  Speaking full sentences with ease, Normal respiratory effort/excursion; Chest: Nontender, Movement normal; Abdomen: Soft, Nontender, Nondistended, Normal bowel sounds; Genitourinary: No CVA tenderness; Extremities: Peripheral pulses normal, No tenderness, No edema, No calf edema or asymmetry.; Neuro: AA&Ox3, Major CN grossly intact.  Speech clear. No gross focal motor or sensory deficits in extremities.; Skin: Color normal, Warm, Dry.   ED Treatments /  Results  Labs (all labs ordered are listed, but only abnormal results are displayed)   EKG None  Radiology   Procedures Procedures (including critical care time)  Medications Ordered in ED Medications  sodium chloride 0.9 % bolus 1,000 mL (0 mLs Intravenous Stopped 11/27/17 1410)  ondansetron (ZOFRAN) injection 4 mg (4 mg Intravenous Given 11/27/17 1401)  famotidine (PEPCID) IVPB 20 mg premix (0 mg Intravenous Stopped 11/27/17 1355)  diphenhydrAMINE (BENADRYL) injection 25 mg (25 mg Intravenous Given 11/27/17 1401)  promethazine (PHENERGAN) injection 12.5 mg (12.5 mg Intravenous Given 11/27/17 1401)  sodium chloride 0.9 % bolus 1,000 mL (0 mLs Intravenous Stopped 11/27/17 1505)     Initial Impression / Assessment and Plan / ED Course  I have reviewed the triage vital signs and the nursing notes.  Pertinent labs & imaging results that were available during my care of the patient were reviewed by me and considered in my medical decision making (see chart for details).  MDM Reviewed: previous chart, nursing note and vitals Reviewed previous: labs Interpretation: labs and  x-ray   Results for orders placed or performed during the hospital encounter of 11/27/17  Comprehensive metabolic panel  Result Value Ref Range   Sodium 135 135 - 145 mmol/L   Potassium 3.6 3.5 - 5.1 mmol/L   Chloride 103 101 - 111 mmol/L   CO2 23 22 - 32 mmol/L   Glucose, Bld 123 (H) 65 - 99 mg/dL   BUN 36 (H) 6 - 20 mg/dL   Creatinine, Ser 1.06 0.61 - 1.24 mg/dL   Calcium 7.4 (L) 8.9 - 10.3 mg/dL   Total Protein 7.0 6.5 - 8.1 g/dL   Albumin 3.7 3.5 - 5.0 g/dL   AST 18 15 - 41 U/L   ALT 21 17 - 63 U/L   Alkaline Phosphatase 108 38 - 126 U/L   Total Bilirubin 1.9 (H) 0.3 - 1.2 mg/dL   GFR calc non Af Amer >60 >60 mL/min   GFR calc Af Amer >60 >60 mL/min   Anion gap 9 5 - 15  Lipase, blood  Result Value Ref Range   Lipase 18 11 - 51 U/L  CBC with Differential  Result Value Ref Range   WBC 5.3 4.0 - 10.5 K/uL   RBC 3.93 (L) 4.22 - 5.81 MIL/uL   Hemoglobin 12.0 (L) 13.0 - 17.0 g/dL   HCT 36.8 (L) 39.0 - 52.0 %   MCV 93.6 78.0 - 100.0 fL   MCH 30.5 26.0 - 34.0 pg   MCHC 32.6 30.0 - 36.0 g/dL   RDW 15.8 (H) 11.5 - 15.5 %   Platelets 210 150 - 400 K/uL   Neutrophils Relative % 84 %   Neutro Abs 4.5 1.7 - 7.7 K/uL   Lymphocytes Relative 9 %   Lymphs Abs 0.5 (L) 0.7 - 4.0 K/uL   Monocytes Relative 7 %   Monocytes Absolute 0.3 0.1 - 1.0 K/uL   Eosinophils Relative 0 %   Eosinophils Absolute 0.0 0.0 - 0.7 K/uL   Basophils Relative 0 %   Basophils Absolute 0.0 0.0 - 0.1 K/uL   Dg Abd Acute W/chest Result Date: 11/27/2017 CLINICAL DATA:  Nausea and vomiting, metastatic cancer of the gallbladder EXAM: DG ABDOMEN ACUTE W/ 1V CHEST COMPARISON:  Chest radiograph 08/08/2016 FINDINGS: RIGHT jugular portacath with tip over SVC. Normal heart size post mediastinum. Mediastinal contours and pulmonary vascularity normal. Complete atelectasis of RIGHT middle lobe. Small RIGHT pleural effusion. LEFT lung clear. No pneumothorax. Nonobstructive bowel  gas pattern. No bowel dilatation, bowel wall  thickening or free air. Stool in rectum. Bones intact with postsurgical changes of lower lumbar fusion. IMPRESSION: Complete atelectasis RIGHT middle lobe with small RIGHT pleural effusion. No acute abdominal findings. Electronically Signed   By: Lavonia Dana M.D.   On: 11/27/2017 13:50    1515:  Pt orthostatic on VS. IVF bolus x2 given. Pt still has not urinated in the ED. Continues to c/o nausea and unable to tol PO  Despite multiple doses of IV meds for same. Dx and testing d/w pt and family.  Questions answered.  Verb understanding, agreeable to admit. T/C returned from [facility]. Darrick Meigs, case discussed, including:  HPI, pertinent PM/SHx, VS/PE, dx testing, ED course and treatment:  Agreeable to admit.   Final Clinical Impressions(s) / ED Diagnoses   Final diagnoses:  Intractable vomiting with nausea, unspecified vomiting type  Orthostatic hypotension    ED Discharge Orders    None       Francine Graven, DO 12/01/17 1648

## 2017-11-28 DIAGNOSIS — G43A1 Cyclical vomiting, intractable: Secondary | ICD-10-CM

## 2017-11-28 LAB — COMPREHENSIVE METABOLIC PANEL
ALT: 16 U/L — ABNORMAL LOW (ref 17–63)
AST: 14 U/L — ABNORMAL LOW (ref 15–41)
Albumin: 3 g/dL — ABNORMAL LOW (ref 3.5–5.0)
Alkaline Phosphatase: 87 U/L (ref 38–126)
Anion gap: 5 (ref 5–15)
BUN: 27 mg/dL — ABNORMAL HIGH (ref 6–20)
CO2: 25 mmol/L (ref 22–32)
Calcium: 6.9 mg/dL — ABNORMAL LOW (ref 8.9–10.3)
Chloride: 105 mmol/L (ref 101–111)
Creatinine, Ser: 1.06 mg/dL (ref 0.61–1.24)
GFR calc Af Amer: >60 mL/min (ref >60)
GFR calc non Af Amer: >60 mL/min (ref >60)
Glucose, Bld: 103 mg/dL — ABNORMAL HIGH (ref 65–99)
Potassium: 4 mmol/L (ref 3.5–5.1)
Sodium: 135 mmol/L (ref 135–145)
Total Bilirubin: 1.1 mg/dL (ref 0.3–1.2)
Total Protein: 5.7 g/dL — ABNORMAL LOW (ref 6.5–8.1)

## 2017-11-28 LAB — CBC
HCT: 29.6 % — ABNORMAL LOW (ref 39.0–52.0)
Hemoglobin: 9.5 g/dL — ABNORMAL LOW (ref 13.0–17.0)
MCH: 30.6 pg (ref 26.0–34.0)
MCHC: 32.1 g/dL (ref 30.0–36.0)
MCV: 95.5 fL (ref 78.0–100.0)
Platelets: 153 10*3/uL (ref 150–400)
RBC: 3.1 MIL/uL — ABNORMAL LOW (ref 4.22–5.81)
RDW: 15.9 % — AB (ref 11.5–15.5)
WBC: 3 10*3/uL — ABNORMAL LOW (ref 4.0–10.5)

## 2017-11-28 NOTE — Plan of Care (Signed)
Patient discharged. Patient alert and oriented x 4 and his vitals are stable. His iv was removed. Reviewed all discharge paper work with patient including follow-up appointments. Patient verbalizes understanding. Discharge paper work signed. Patient escorted to car via wheelchair by nursing staff.

## 2017-11-28 NOTE — Discharge Summary (Signed)
Physician Discharge Summary  NORMAL RECINOS ALP:[id number] DOB: [birth year] DOA: 11/27/2017  PCP: Rory Percy, MD  Admit date: 11/27/2017  Discharge date: 11/28/2017  Admitted From:Home  Disposition:  Home  Recommendations for Outpatient Follow-up:  1. Follow up with PCP in 1-2 weeks  Home Health:N/A  Equipment/Devices:N/A  Discharge Condition:Stable  CODE STATUS: DNR  Diet recommendation: Heart Healthy  Brief/Interim Summary:  Robert Mack  is a 57 y.o. male, with history of CAD status post CABG in 2010, essential hypertension, hyperlipidemia, SVT, sleep apnea, metastatic glandular carcinoma, with mets to right lung, on  Chemotherapy, who recently underwent chemotherapy with oxaliplatin, 5-FU started as well.  Vomiting and multiple episodes of vomiting after chemotherapy.  He also had intractable nausea.  He no longer has any further symptomatology and is feeling much better and tolerating diet.  No acute events noted during this admission.  Patient will follow up with his primary care provider in the next 1 to 2 weeks.   Discharge Diagnoses:  Active Problems:   CAD, ARTERY BYPASS GRAFT   HTN (hypertension)   Intractable nausea and vomiting  1. Chemotherapy-induced intractable nausea and vomiting.  This has resolved after IV medications have been administered.  He is feeling much better this morning and tolerating diet.  He will continue home medications as needed.  Follow-up at [facility] regarding further cancer care. 2. Metastatic cholangiocarcinoma.  Continue chemotherapy as recommended and follow-up with [facility] oncology. 3. Chronic pain.  This is related to cancer.  Continue home narcotics as prescribed. 4. Hypertension.  Stable blood pressure readings noted-continue metoprolol. 5. CAD status post CABG.  Continue home aspirin and metoprolol with no chest pain noted.  Discharge Instructions  Discharge Instructions    Diet - low sodium heart healthy   Complete by:   As directed    Increase activity slowly   Complete by:  As directed      Allergies as of 11/28/2017      Reactions   Shellfish Allergy Anaphylaxis, Swelling   Baclofen Nausea And Vomiting   Tramadol Nausea And Vomiting      Medication List    TAKE these medications   aspirin EC 81 MG tablet Take 81 mg by mouth daily.   dexamethasone 4 MG tablet Commonly known as:  DECADRON Take 2 tablets by mouth daily as needed. On day 2 and 3 after chemotherapy   LORazepam 0.5 MG tablet Commonly known as:  ATIVAN Take 0.5 mg by mouth every 6 (six) hours as needed. for nausea   methocarbamol 500 MG tablet Commonly known as:  ROBAXIN Take 500 mg by mouth 4 (four) times daily.   metoprolol succinate 25 MG 24 hr tablet Commonly known as:  TOPROL XL Take 0.5 tablets (12.5 mg total) by mouth 2 (two) times daily.   omeprazole 20 MG capsule Commonly known as:  PRILOSEC Take 20 mg by mouth daily.   ondansetron 8 MG tablet Commonly known as:  ZOFRAN Take 8 mg by mouth every 8 (eight) hours as needed.   OXYCONTIN 10 mg 12 hr tablet Generic drug:  oxyCODONE Take 1 tablet by mouth 2 (two) times daily.   oxyCODONE 5 MG immediate release tablet Commonly known as:  Oxy IR/ROXICODONE Take 1-2 tablets by mouth every 4 (four) hours as needed.   prochlorperazine 5 MG tablet Commonly known as:  COMPAZINE Take 5 mg by mouth every 6 (six) hours as needed. for nausea   zolpidem 10 MG tablet Commonly known as:  AMBIEN Take 10 mg by mouth at bedtime as needed for sleep.      Follow-up Information    Rory Percy, MD Follow up in 2 week(s).   Specialty:  Family Medicine Contact information: [address] [place] [address] [phone]          Allergies  Allergen Reactions  . Shellfish Allergy Anaphylaxis and Swelling  . Baclofen Nausea And Vomiting  . Tramadol Nausea And Vomiting    Consultations:  None   Procedures/Studies: Dg Abd Acute W/chest  Result Date:  11/27/2017 CLINICAL DATA:  Nausea and vomiting, metastatic cancer of the gallbladder EXAM: DG ABDOMEN ACUTE W/ 1V CHEST COMPARISON:  Chest radiograph 08/08/2016 FINDINGS: RIGHT jugular portacath with tip over SVC. Normal heart size post mediastinum. Mediastinal contours and pulmonary vascularity normal. Complete atelectasis of RIGHT middle lobe. Small RIGHT pleural effusion. LEFT lung clear. No pneumothorax. Nonobstructive bowel gas pattern. No bowel dilatation, bowel wall thickening or free air. Stool in rectum. Bones intact with postsurgical changes of lower lumbar fusion. IMPRESSION: Complete atelectasis RIGHT middle lobe with small RIGHT pleural effusion. No acute abdominal findings. Electronically Signed   By: Lavonia Dana M.D.   On: 11/27/2017 13:50   Discharge Exam: Vitals:   11/27/17 2047 11/28/17 0635  BP: 112/71 100/67  Pulse: 72 64  Resp:    Temp: 97.9 F (36.6 C) 98 F (36.7 C)  SpO2: 98% 98%   Vitals:   11/27/17 1217 11/27/17 1732 11/27/17 2047 11/28/17 0635  BP: (!) 143/94 115/67 112/71 100/67  Pulse: 81 75 72 64  Resp: 12 20    Temp: 97.9 F (36.6 C) 98.3 F (36.8 C) 97.9 F (36.6 C) 98 F (36.7 C)  TempSrc: Oral Oral Oral Oral  SpO2: 100% 100% 98% 98%  Weight:      Height:        General: Pt is alert, awake, not in acute distress Cardiovascular: RRR, S1/S2 +, no rubs, no gallops Respiratory: CTA bilaterally, no wheezing, no rhonchi Abdominal: Soft, NT, ND, bowel sounds + Extremities: no edema, no cyanosis    The results of significant diagnostics from this hospitalization (including imaging, microbiology, ancillary and laboratory) are listed below for reference.     Microbiology: No results found for this or any previous visit (from the past 240 hour(s)).   Labs: BNP (last 3 results) No results for input(s): BNP in the last 8760 hours. Basic Metabolic Panel: Recent Labs  Lab 11/27/17 1235 11/28/17 0440  NA 135 135  K 3.6 4.0  CL 103 105  CO2 23 25   GLUCOSE 123* 103*  BUN 36* 27*  CREATININE 1.06 1.06  CALCIUM 7.4* 6.9*   Liver Function Tests: Recent Labs  Lab 11/27/17 1235 11/28/17 0440  AST 18 14*  ALT 21 16*  ALKPHOS 108 87  BILITOT 1.9* 1.1  PROT 7.0 5.7*  ALBUMIN 3.7 3.0*   Recent Labs  Lab 11/27/17 1235  LIPASE 18   No results for input(s): AMMONIA in the last 168 hours. CBC: Recent Labs  Lab 11/27/17 1235 11/28/17 0440  WBC 5.3 3.0*  NEUTROABS 4.5  --   HGB 12.0* 9.5*  HCT 36.8* 29.6*  MCV 93.6 95.5  PLT 210 153   Cardiac Enzymes: No results for input(s): CKTOTAL, CKMB, CKMBINDEX, TROPONINI in the last 168 hours. BNP: Invalid input(s): POCBNP CBG: No results for input(s): GLUCAP in the last 168 hours. D-Dimer No results for input(s): DDIMER in the last 72 hours. Hgb A1c No  results for input(s): HGBA1C in the last 72 hours. Lipid Profile No results for input(s): CHOL, HDL, LDLCALC, TRIG, CHOLHDL, LDLDIRECT in the last 72 hours. Thyroid function studies No results for input(s): TSH, T4TOTAL, T3FREE, THYROIDAB in the last 72 hours.  Invalid input(s): FREET3 Anemia work up No results for input(s): VITAMINB12, FOLATE, FERRITIN, TIBC, IRON, RETICCTPCT in the last 72 hours. Urinalysis No results found for: COLORURINE, APPEARANCEUR, LABSPEC, [place], GLUCOSEU, HGBUR, BILIRUBINUR, KETONESUR, PROTEINUR, UROBILINOGEN, NITRITE, LEUKOCYTESUR Sepsis Labs Invalid input(s): PROCALCITONIN,  WBC,  LACTICIDVEN Microbiology No results found for this or any previous visit (from the past 240 hour(s)).   Time coordinating discharge: 35 minutes  SIGNED:   Rodena Goldmann, DO [facility] 11/28/2017, 10:01 AM Pager [phone]  If 7PM-7AM, please contact night-coverage [email or web address] Password [email or web address]

## 2017-11-29 LAB — HIV ANTIBODY (ROUTINE TESTING W REFLEX): HIV SCREEN 4TH GENERATION: NONREACTIVE

## 2017-12-16 ENCOUNTER — Emergency Department (HOSPITAL_COMMUNITY): Payer: 59

## 2017-12-16 ENCOUNTER — Observation Stay (HOSPITAL_COMMUNITY)
Admission: EM | Admit: 2017-12-16 | Discharge: 2017-12-17 | Disposition: A | Discharge status: Home / Self Care | Payer: 59 | Attending: Internal Medicine | Admitting: Internal Medicine

## 2017-12-16 ENCOUNTER — Encounter (HOSPITAL_COMMUNITY): Payer: Self-pay

## 2017-12-16 ENCOUNTER — Other Ambulatory Visit: Payer: Self-pay

## 2017-12-16 ENCOUNTER — Encounter (HOSPITAL_COMMUNITY)
Admission: RE | Admit: 2017-12-16 | Discharge: 2017-12-16 | Disposition: A | Discharge status: Home / Self Care | Payer: 59 | Source: Ambulatory Visit | Attending: Hematology and Oncology | Admitting: Hematology and Oncology

## 2017-12-16 DIAGNOSIS — C7951 Secondary malignant neoplasm of bone: Secondary | ICD-10-CM | POA: Diagnosis not present

## 2017-12-16 DIAGNOSIS — C221 Intrahepatic bile duct carcinoma: Secondary | ICD-10-CM | POA: Diagnosis present

## 2017-12-16 DIAGNOSIS — R531 Weakness: Secondary | ICD-10-CM | POA: Diagnosis not present

## 2017-12-16 DIAGNOSIS — R112 Nausea with vomiting, unspecified: Secondary | ICD-10-CM | POA: Insufficient documentation

## 2017-12-16 DIAGNOSIS — R4182 Altered mental status, unspecified: Secondary | ICD-10-CM | POA: Insufficient documentation

## 2017-12-16 DIAGNOSIS — Z87891 Personal history of nicotine dependence: Secondary | ICD-10-CM | POA: Insufficient documentation

## 2017-12-16 DIAGNOSIS — E860 Dehydration: Principal | ICD-10-CM | POA: Insufficient documentation

## 2017-12-16 DIAGNOSIS — Z85830 Personal history of malignant neoplasm of bone: Secondary | ICD-10-CM | POA: Insufficient documentation

## 2017-12-16 DIAGNOSIS — Z79899 Other long term (current) drug therapy: Secondary | ICD-10-CM | POA: Insufficient documentation

## 2017-12-16 DIAGNOSIS — I10 Essential (primary) hypertension: Secondary | ICD-10-CM | POA: Diagnosis not present

## 2017-12-16 DIAGNOSIS — I2510 Atherosclerotic heart disease of native coronary artery without angina pectoris: Secondary | ICD-10-CM | POA: Diagnosis not present

## 2017-12-16 DIAGNOSIS — Z951 Presence of aortocoronary bypass graft: Secondary | ICD-10-CM | POA: Insufficient documentation

## 2017-12-16 DIAGNOSIS — T451X5A Adverse effect of antineoplastic and immunosuppressive drugs, initial encounter: Secondary | ICD-10-CM | POA: Insufficient documentation

## 2017-12-16 DIAGNOSIS — G9341 Metabolic encephalopathy: Secondary | ICD-10-CM | POA: Diagnosis not present

## 2017-12-16 DIAGNOSIS — Z7982 Long term (current) use of aspirin: Secondary | ICD-10-CM | POA: Diagnosis not present

## 2017-12-16 DIAGNOSIS — R5383 Other fatigue: Secondary | ICD-10-CM | POA: Diagnosis present

## 2017-12-16 HISTORY — DX: Malignant (primary) neoplasm, unspecified: C80.1

## 2017-12-16 LAB — URINALYSIS, ROUTINE W REFLEX MICROSCOPIC
BILIRUBIN URINE: NEGATIVE
Glucose, UA: NEGATIVE mg/dL
Hgb urine dipstick: NEGATIVE
KETONES UR: 5 mg/dL — AB
Leukocytes, UA: NEGATIVE
NITRITE: NEGATIVE
PROTEIN: NEGATIVE mg/dL
Specific Gravity, Urine: 1.015 (ref 1.005–1.030)
pH: 7 (ref 5.0–8.0)

## 2017-12-16 LAB — COMPREHENSIVE METABOLIC PANEL
ALT: 25 U/L (ref 0–44)
AST: 25 U/L (ref 15–41)
Albumin: 3.1 g/dL — ABNORMAL LOW (ref 3.5–5.0)
Alkaline Phosphatase: 116 U/L (ref 38–126)
Anion gap: 11 (ref 5–15)
BILIRUBIN TOTAL: 1 mg/dL (ref 0.3–1.2)
BUN: 20 mg/dL (ref 6–20)
CALCIUM: 7.6 mg/dL — AB (ref 8.9–10.3)
CO2: 21 mmol/L — ABNORMAL LOW (ref 22–32)
CREATININE: 0.89 mg/dL (ref 0.61–1.24)
Chloride: 107 mmol/L (ref 98–111)
Glucose, Bld: 124 mg/dL — ABNORMAL HIGH (ref 70–99)
Potassium: 4.5 mmol/L (ref 3.5–5.1)
Sodium: 139 mmol/L (ref 135–145)
TOTAL PROTEIN: 6.2 g/dL — AB (ref 6.5–8.1)

## 2017-12-16 LAB — CBC
HCT: 35.5 % — ABNORMAL LOW (ref 39.0–52.0)
Hemoglobin: 11 g/dL — ABNORMAL LOW (ref 13.0–17.0)
MCH: 29.4 pg (ref 26.0–34.0)
MCHC: 31 g/dL (ref 30.0–36.0)
MCV: 94.9 fL (ref 78.0–100.0)
PLATELETS: 161 10*3/uL (ref 150–400)
RBC: 3.74 MIL/uL — ABNORMAL LOW (ref 4.22–5.81)
RDW: 16.2 % — ABNORMAL HIGH (ref 11.5–15.5)
WBC: 5.7 10*3/uL (ref 4.0–10.5)

## 2017-12-16 LAB — T4, FREE: Free T4: 0.94 ng/dL (ref 0.82–1.77)

## 2017-12-16 LAB — VITAMIN B12: Vitamin B-12: 1938 pg/mL — ABNORMAL HIGH (ref 180–914)

## 2017-12-16 LAB — TSH: TSH: 1.288 u[IU]/mL (ref 0.350–4.500)

## 2017-12-16 MED ORDER — SODIUM CHLORIDE 0.9 % IV SOLN
INTRAVENOUS | Status: DC
  Administered 2017-12-16 – 2017-12-17 (×3): via INTRAVENOUS

## 2017-12-16 MED ORDER — LORAZEPAM 0.5 MG PO TABS: 0.5000 mg | ORAL_TABLET | Freq: Four times a day (QID) | ORAL | Status: DC | PRN

## 2017-12-16 MED ORDER — HEPARIN SOD (PORK) LOCK FLUSH 100 UNIT/ML IV SOLN
INTRAVENOUS | Status: AC
  Filled 2017-12-16: qty 5

## 2017-12-16 MED ORDER — PROCHLORPERAZINE EDISYLATE 10 MG/2ML IJ SOLN
5.0000 mg | Freq: Once | INTRAMUSCULAR | Status: AC | PRN
  Administered 2017-12-16: 5 mg via INTRAVENOUS
  Filled 2017-12-16: qty 1

## 2017-12-16 MED ORDER — PROMETHAZINE HCL 25 MG/ML IJ SOLN: 12.5000 mg | Freq: Four times a day (QID) | INTRAMUSCULAR | Status: DC | PRN

## 2017-12-16 MED ORDER — SODIUM CHLORIDE 0.9 % IV BOLUS
1000.0000 mL | Freq: Once | INTRAVENOUS | Status: AC
  Administered 2017-12-16: 1000 mL via INTRAVENOUS

## 2017-12-16 MED ORDER — PROCHLORPERAZINE EDISYLATE 10 MG/2ML IJ SOLN
10.0000 mg | Freq: Once | INTRAMUSCULAR | Status: DC | PRN
  Filled 2017-12-16: qty 2

## 2017-12-16 MED ORDER — PROCHLORPERAZINE EDISYLATE 10 MG/2ML IJ SOLN
INTRAMUSCULAR | Status: AC
  Filled 2017-12-16: qty 2

## 2017-12-16 MED ORDER — PANTOPRAZOLE SODIUM 40 MG PO TBEC
40.0000 mg | DELAYED_RELEASE_TABLET | Freq: Every day | ORAL | Status: DC
  Administered 2017-12-16 – 2017-12-17 (×2): 40 mg via ORAL
  Filled 2017-12-16 (×3): qty 1

## 2017-12-16 MED ORDER — ENOXAPARIN SODIUM 40 MG/0.4ML ~~LOC~~ SOLN: 40.0000 mg | SUBCUTANEOUS | Status: DC

## 2017-12-16 MED ORDER — ACETAMINOPHEN 325 MG PO TABS: 650.0000 mg | ORAL_TABLET | Freq: Four times a day (QID) | ORAL | Status: DC | PRN

## 2017-12-16 MED ORDER — OXYCODONE HCL 5 MG PO TABS: 5.0000 mg | ORAL_TABLET | ORAL | Status: DC | PRN

## 2017-12-16 MED ORDER — HEPARIN SOD (PORK) LOCK FLUSH 100 UNIT/ML IV SOLN
500.0000 [IU] | Freq: Once | INTRAVENOUS | Status: AC
  Administered 2017-12-16: 500 [IU]

## 2017-12-16 MED ORDER — SODIUM CHLORIDE 0.9% FLUSH
INTRAVENOUS | Status: AC
Start: 2017-12-16 — End: ?
  Filled 2017-12-16: qty 10

## 2017-12-16 MED ORDER — ONDANSETRON HCL 4 MG/2ML IJ SOLN
4.0000 mg | Freq: Once | INTRAMUSCULAR | Status: AC
  Administered 2017-12-16: 4 mg via INTRAVENOUS
  Filled 2017-12-16: qty 2

## 2017-12-16 MED ORDER — ACETAMINOPHEN 650 MG RE SUPP: 650.0000 mg | Freq: Four times a day (QID) | RECTAL | Status: DC | PRN

## 2017-12-16 MED ORDER — METOPROLOL SUCCINATE ER 25 MG PO TB24
25.0000 mg | ORAL_TABLET | Freq: Every day | ORAL | Status: DC
  Administered 2017-12-16 – 2017-12-17 (×2): 25 mg via ORAL
  Filled 2017-12-16 (×3): qty 1

## 2017-12-16 MED ORDER — DEXAMETHASONE SODIUM PHOSPHATE 10 MG/ML IJ SOLN
4.0000 mg | Freq: Once | INTRAMUSCULAR | Status: AC
  Administered 2017-12-17: 4 mg via INTRAVENOUS
  Filled 2017-12-16 (×2): qty 1

## 2017-12-16 MED ORDER — ONDANSETRON HCL 4 MG/2ML IJ SOLN
4.0000 mg | Freq: Four times a day (QID) | INTRAMUSCULAR | Status: AC
  Administered 2017-12-16 – 2017-12-17 (×4): 4 mg via INTRAVENOUS
  Filled 2017-12-16 (×4): qty 2

## 2017-12-16 MED ORDER — SODIUM CHLORIDE 0.9 % IV SOLN
INTRAVENOUS | Status: DC
  Administered 2017-12-16: 10:00:00 via INTRAVENOUS

## 2017-12-16 NOTE — ED Triage Notes (Signed)
Pt brought to er from specialty clinic.  RN reports pt was there to receive 2 liters of fluid.  Pt presently receiving chemo in port.  Reports chemo was started yesterday.  Pt extremely weak, sweaty, vomiting.    Pt had 5FU chemo yesterday and is receiving adrucil presently at 37ml/hr and is supposed to come off tomorrow.  Specialty clinic is going to call oncologist to see if chemo needs to be stopped.  Daughter told RN that pt gets weak and nauseated after his infusion of 5FU but never this bad.

## 2017-12-16 NOTE — ED Notes (Signed)
Dr. Annitta Jersey Cell  [place], NP [phone] Pager

## 2017-12-16 NOTE — ED Provider Notes (Signed)
[facility] EMERGENCY DEPARTMENT Provider Note   CSN: [id number] Arrival date & time: 12/16/17  1009     History   Chief Complaint Chief Complaint  Patient presents with  . Fatigue  . Emesis    HPI Robert Mack is a 57 y.o. male.  Pt with hx cholangioca mets to bone, recent chemo yesterday, with acute onset nausea, vomiting and generalized weakness this AM. Daughter notes w prior chemo similar rxn with weakness, nausea/vomiting, but seems worse today, and more confused today. Patient was to get ns boluses at short stay, but sent to ED for evaluation. No new chemo yesterday, recent the same two agents, one at lower dose than prior. Patient poorly responsive to questions - very weak - level 5 caveat. Hx largely from daughter. No report of fevers. No trauma or fall.   The history is provided by the patient and a relative.  Emesis   Pertinent negatives include no abdominal pain, no fever and no headaches.    Past Medical History:  Diagnosis Date  . Anxiety   . Atrial fibrillation (HCC)    postoperatively  . CAD (coronary artery disease)    status post coronary artery bypass grafting 2010 with normal LV function  . Cancer ([place])    bone cancer  . HLD (hyperlipidemia)   . HTN (hypertension)    borderline controlled  . Sleep apnea     Patient Active Problem List   Diagnosis Date Noted  . Intractable nausea and vomiting 11/27/2017  . CAD (coronary artery disease)   . HLD (hyperlipidemia)   . HTN (hypertension)   . PANIC DISORDER WITH AGORAPHOBIA 02/07/2009  . HYPERCHOLESTEROLEMIA  IIA 02/06/2009  . CAD, ARTERY BYPASS GRAFT 02/06/2009  . ATRIAL FIBRILLATION 02/06/2009    Past Surgical History:  Procedure Laterality Date  . BILIARY STENT PLACEMENT N/A 05/24/2015   Procedure: BILIARY STENT PLACEMENT;  Surgeon: Rogene Houston, MD;  Location: [facility];  Service: Gastroenterology;  Laterality: N/A;  . CARDIAC CATHETERIZATION  2006, 2010   left heart  . CATARACT EXTRACTION  Left 2014  . CHOLECYSTECTOMY    . CORONARY ARTERY BYPASS GRAFT  2010   x3  . ERCP N/A 05/24/2015   Procedure: ENDOSCOPIC RETROGRADE CHOLANGIOPANCREATOGRAPHY (ERCP);  Surgeon: Rogene Houston, MD;  Location: [facility];  Service: Gastroenterology;  Laterality: N/A;  . LUMBAR FUSION N/A 2016  . SPHINCTEROTOMY N/A 05/24/2015   Procedure: SPHINCTEROTOMY;  Surgeon: Rogene Houston, MD;  Location: [facility];  Service: Gastroenterology;  Laterality: N/A;        Home Medications    Prior to Admission medications   Medication Sig Start Date End Date Taking? Authorizing Provider  potassium chloride (KLOR-CON) 8 MEQ tablet Take 1 tablet by mouth 2 (two) times daily. 12/03/17  Yes [provider]  aspirin EC 81 MG tablet Take 81 mg by mouth daily.    [provider]  dexamethasone (DECADRON) 4 MG tablet Take 2 tablets by mouth daily as needed. On day 2 and 3 after chemotherapy 11/05/17   [provider]  LORazepam (ATIVAN) 0.5 MG tablet Take 0.5 mg by mouth every 6 (six) hours as needed. for nausea 11/05/17   [provider]  methocarbamol (ROBAXIN) 500 MG tablet Take 500 mg by mouth 4 (four) times daily. 11/24/17   [provider]  metoprolol succinate (TOPROL XL) 25 MG 24 hr tablet Take 0.5 tablets (12.5 mg total) by mouth 2 (two) times daily. 12/19/15   Kate Sable  A, MD  OLANZapine (ZYPREXA) 5 MG tablet Take 1 tablet by mouth as directed. TAKE 1 TABLET BY MOUTH DAILY FOR FOUR DAYS STARTING THE DAY of chemo AND FOR THREE DAYS AFTER 12/03/17   [provider]  omeprazole (PRILOSEC) 20 MG capsule Take 20 mg by mouth daily.    [provider]  ondansetron (ZOFRAN) 8 MG tablet Take 8 mg by mouth every 8 (eight) hours as needed. 08/25/17   [provider]  oxyCODONE (OXY IR/ROXICODONE) 5 MG immediate release tablet Take 1-2 tablets by mouth every 4 (four) hours as needed. 11/24/17   [provider]  OXYCONTIN 10 MG 12 hr tablet Take  1 tablet by mouth 2 (two) times daily. 11/24/17   [provider]  prochlorperazine (COMPAZINE) 5 MG tablet Take 5 mg by mouth every 6 (six) hours as needed. for nausea 08/25/17   [provider]  zolpidem (AMBIEN) 10 MG tablet Take 10 mg by mouth at bedtime as needed for sleep.    [provider]    Family History Family History  Problem Relation Age of Onset  . Heart disease Unknown        premature cardiovascular disease, FH  . Coronary artery disease Unknown     Social History Social History   Tobacco Use  . Smoking status: Former Smoker    Packs/day: 0.25    Years: 17.00    Pack years: 4.25    Types: Cigarettes    Start date: 06/25/1971    Last attempt to quit: 06/24/1988    Years since quitting: 29.4  . Smokeless tobacco: Never Used  . Tobacco comment: quit 1990  Substance Use Topics  . Alcohol use: Yes    Alcohol/week: 0.0 oz    Comment: Socially-beer  . Drug use: No     Allergies   Shellfish allergy; Baclofen; and Tramadol   Review of Systems Review of Systems  Constitutional: Negative for fever.  HENT: Negative for sore throat.   Eyes: Negative for redness.  Respiratory: Negative for shortness of breath.   Cardiovascular: Negative for chest pain.  Gastrointestinal: Positive for vomiting. Negative for abdominal pain.  Genitourinary: Negative for flank pain.  Musculoskeletal: Negative for back pain and neck pain.  Skin: Negative for rash.  Neurological: Positive for weakness. Negative for headaches.  Hematological: Does not bruise/bleed easily.  Psychiatric/Behavioral: The patient is not nervous/anxious.      Physical Exam Updated Vital Signs There were no vitals taken for this visit.  Physical Exam  Constitutional: He appears well-developed and well-nourished.  HENT:  Mouth/Throat: Oropharynx is clear and moist.  Eyes: Conjunctivae are normal.  Neck: Neck supple. No tracheal deviation present.  No stiffness or rigidity, no  bruits.   Cardiovascular: Normal rate, regular rhythm, normal heart sounds and intact distal pulses. Exam reveals no gallop and no friction rub.  No murmur heard. Pulmonary/Chest: Effort normal and breath sounds normal. No accessory muscle usage. No respiratory distress.  Port right chest without sign of infection.   Abdominal: Soft. Bowel sounds are normal. He exhibits no distension and no mass. There is no tenderness. There is no rebound and no guarding. No hernia.  Genitourinary:  Genitourinary Comments: No cva tenderness  Musculoskeletal: He exhibits no edema.  Neurological:  Mildly lethargic, slow to respond. Moves bilateral extremities purposefully.   Skin: Skin is warm and dry. No rash noted. He is not diaphoretic.  Psychiatric:  Slow to respond.   Nursing note and vitals reviewed.  ED Treatments / Results  Labs (all labs ordered are listed, but only abnormal results are displayed) Results for orders placed or performed during the hospital encounter of 12/16/17  CBC  Result Value Ref Range   WBC 5.7 4.0 - 10.5 K/uL   RBC 3.74 (L) 4.22 - 5.81 MIL/uL   Hemoglobin 11.0 (L) 13.0 - 17.0 g/dL   HCT 35.5 (L) 39.0 - 52.0 %   MCV 94.9 78.0 - 100.0 fL   MCH 29.4 26.0 - 34.0 pg   MCHC 31.0 30.0 - 36.0 g/dL   RDW 16.2 (H) 11.5 - 15.5 %   Platelets 161 150 - 400 K/uL  Comprehensive metabolic panel  Result Value Ref Range   Sodium 139 135 - 145 mmol/L   Potassium 4.5 3.5 - 5.1 mmol/L   Chloride 107 98 - 111 mmol/L   CO2 21 (L) 22 - 32 mmol/L   Glucose, Bld 124 (H) 70 - 99 mg/dL   BUN 20 6 - 20 mg/dL   Creatinine, Ser 0.89 0.61 - 1.24 mg/dL   Calcium 7.6 (L) 8.9 - 10.3 mg/dL   Total Protein 6.2 (L) 6.5 - 8.1 g/dL   Albumin 3.1 (L) 3.5 - 5.0 g/dL   AST 25 15 - 41 U/L   ALT 25 0 - 44 U/L   Alkaline Phosphatase 116 38 - 126 U/L   Total Bilirubin 1.0 0.3 - 1.2 mg/dL   GFR calc non Af Amer >60 >60 mL/min   GFR calc Af Amer >60 >60 mL/min   Anion gap 11 5 - 15  Urinalysis,  Routine w reflex microscopic  Result Value Ref Range   Color, Urine STRAW (A) YELLOW   APPearance CLEAR CLEAR   Specific Gravity, Urine 1.015 1.005 - 1.030   pH 7.0 5.0 - 8.0   Glucose, UA NEGATIVE NEGATIVE mg/dL   Hgb urine dipstick NEGATIVE NEGATIVE   Bilirubin Urine NEGATIVE NEGATIVE   Ketones, ur 5 (A) NEGATIVE mg/dL   Protein, ur NEGATIVE NEGATIVE mg/dL   Nitrite NEGATIVE NEGATIVE   Leukocytes, UA NEGATIVE NEGATIVE   Dg Abd Acute W/chest  Result Date: 11/27/2017 CLINICAL DATA:  Nausea and vomiting, metastatic cancer of the gallbladder EXAM: DG ABDOMEN ACUTE W/ 1V CHEST COMPARISON:  Chest radiograph 08/08/2016 FINDINGS: RIGHT jugular portacath with tip over SVC. Normal heart size post mediastinum. Mediastinal contours and pulmonary vascularity normal. Complete atelectasis of RIGHT middle lobe. Small RIGHT pleural effusion. LEFT lung clear. No pneumothorax. Nonobstructive bowel gas pattern. No bowel dilatation, bowel wall thickening or free air. Stool in rectum. Bones intact with postsurgical changes of lower lumbar fusion. IMPRESSION: Complete atelectasis RIGHT middle lobe with small RIGHT pleural effusion. No acute abdominal findings. Electronically Signed   By: Lavonia Dana M.D.   On: 11/27/2017 13:50    None  Radiology No results found.  Procedures Procedures (including critical care time)  Medications Ordered in ED Medications  prochlorperazine (COMPAZINE) 10 MG/2ML injection (has no administration in time range)  heparin lock flush 100 UNIT/ML injection (has no administration in time range)  sodium chloride 0.9 % bolus 1,000 mL (has no administration in time range)     Initial Impression / Assessment and Plan / ED Course  I have reviewed the triage vital signs and the nursing notes.  Pertinent labs & imaging results that were available during my care of the patient were reviewed by me and considered in my medical decision making (see chart for details).  Iv ns bolus.  Labs. Pt  given compazine by infusion center/onc nurse.   Daughter notes degree of mental status change new/different from prior reaction to chemo - will get imaging.   Reviewed nursing notes and prior charts for additional history.   Ct reviewed - no acute process noted.  Additional fluids. Pt remains with poor po intake, malaise, gen weakness, nausea - will admit for ivf, obs.   hospitalists consulted for admission.    Final Clinical Impressions(s) / ED Diagnoses   Final diagnoses:  None    ED Discharge Orders    None       Lajean Saver, MD 12/16/17 1310

## 2017-12-16 NOTE — H&P (Signed)
History and Physical  Robert Mack WCB:[id number] DOB: 02/15/1961 DOA: 12/16/2017   PCP: Robert Percy, MD   Patient coming from: Home  Chief Complaint: lethargy, n/v  HPI:  Robert Mack is a 57 y.o. 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  The patient had been in his usual state of health until 12/15/17 when he received his last chemotherapy at [facility].  He is currently on FOLFOX, and has had a similar presentation with hospitalization after his last chemotherapy.  He was admitted to [facility] on 11/27/17 to 11/28/17 when he improved with IVF after suffering intractable N/V.  His chemo dose was reduced, but he again began having N/V as discussed above.  He denies any new meds.  He has not taken more oxycodone tabs.  He was started on olanzapine to help his n/v which he started on 6/23.  He denies cp, sob, HA, visual disturbance, abd pain, dysuria, abd pain.  His family noted that the patient was more lethargic and having worsening generalized weakness on the morning of 12/16/2017.  As result, the patient was brought to emergency department for further evaluation.  In the emergency department, the patient was afebrile hemodynamically stable letter 100% on room air.  BMP, LFTs, and CBC were essentially unremarkable with his hemoglobin at baseline.  CT of the brain was unremarkable.  Patient was given 2 L of normal saline with some improvement in his mental status.  The patient was admitted for further evaluation.  Assessment/Plan: Acute metabolic encephalopathy -Due in part to dehydration as well as olanzapine which he started on 12/14/2017 -Urinalysis negative for pyuria -Check TSH -Serum B12 -CT brain negative for acute findings -Discontinue Ambien -Hold long-acting OxyContin for now until the patient becomes more alert -Holding Zyprexa  Intractable nausea  and vomiting -Secondary to chemotherapy -Continue IV fluids -Start scheduled Zofran -Clear liquid diet for now  Dehydration -The patient has noted ketonuria -Continue IV fluids -A.m. BMP  Metastatic cholangiocarcinoma to the bone -Patient last received radiation 12/11/2017 at [facility] extra dose of dexamethasone post chemo to help with his nausea  Cancer related pain -Continue oxycodone -Holding long-acting OxyContin temporarily until the patient is more alert  Essential hypertension -Continue metoprolol succinate        Past Medical History:  Diagnosis Date  . Anxiety   . Atrial fibrillation (HCC)    postoperatively  . CAD (coronary artery disease)    status post coronary artery bypass grafting 2010 with normal LV function  . Cancer ([place])    bone cancer  . HLD (hyperlipidemia)   . HTN (hypertension)    borderline controlled  . Sleep apnea    Past Surgical History:  Procedure Laterality Date  . BILIARY STENT PLACEMENT N/A 05/24/2015   Procedure: BILIARY STENT PLACEMENT;  Surgeon: Rogene Houston, MD;  Location: [facility];  Service: Gastroenterology;  Laterality: N/A;  . CARDIAC CATHETERIZATION  2006, 2010   left heart  . CATARACT EXTRACTION Left 2014  . CHOLECYSTECTOMY    . CORONARY ARTERY BYPASS GRAFT  2010   x3  . ERCP N/A 05/24/2015   Procedure: ENDOSCOPIC RETROGRADE CHOLANGIOPANCREATOGRAPHY (ERCP);  Surgeon: Rogene Houston, MD;  Location: [facility];  Service: Gastroenterology;  Laterality: N/A;  . LUMBAR FUSION N/A 2016  . SPHINCTEROTOMY N/A 05/24/2015   Procedure: SPHINCTEROTOMY;  Surgeon: Rogene Houston, MD;  Location: [facility];  Service: Gastroenterology;  Laterality: N/A;   Social History:  reports that he quit smoking about 29 years ago. His smoking use included cigarettes. He started smoking about 46 years ago. He has a 4.25 pack-year smoking history. He has never used smokeless tobacco. He reports that he drinks alcohol. He reports that he does not  use drugs.   Family History  Problem Relation Age of Onset  . Heart disease Unknown        premature cardiovascular disease, FH  . Coronary artery disease Unknown      Allergies  Allergen Reactions  . Shellfish Allergy Anaphylaxis and Swelling  . Baclofen Nausea And Vomiting  . Tramadol Nausea And Vomiting     Prior to Admission medications   Medication Sig Start Date End Date Taking? Authorizing Provider  aspirin EC 81 MG tablet Take 81 mg by mouth daily.   Yes [provider]  dexamethasone (DECADRON) 4 MG tablet Take 2 tablets by mouth daily as needed. On day 2 and 3 after chemotherapy 11/05/17  Yes [provider]  LORazepam (ATIVAN) 0.5 MG tablet Take 0.5 mg by mouth every 6 (six) hours as needed. for nausea 11/05/17  Yes [provider]  metoprolol succinate (TOPROL XL) 25 MG 24 hr tablet Take 0.5 tablets (12.5 mg total) by mouth 2 (two) times daily. Patient taking differently: Take 25 mg by mouth daily.  12/19/15  Yes [name], MD  OLANZapine (ZYPREXA) 5 MG tablet Take 1 tablet by mouth as directed. TAKE 1 TABLET BY MOUTH DAILY FOR FOUR DAYS STARTING THE DAY of chemo AND FOR THREE DAYS AFTER 12/03/17  Yes [provider]  omeprazole (PRILOSEC) 20 MG capsule Take 20 mg by mouth daily as needed.    Yes [provider]  ondansetron (ZOFRAN) 8 MG tablet Take 8 mg by mouth every 8 (eight) hours as needed. 08/25/17  Yes [provider]  OXYCONTIN 10 MG 12 hr tablet Take 1 tablet by mouth 2 (two) times daily. 11/24/17  Yes [provider]  potassium chloride (KLOR-CON) 8 MEQ tablet Take 1 tablet by mouth 2 (two) times daily. 12/03/17  Yes [provider]  prochlorperazine (COMPAZINE) 5 MG tablet Take 5 mg by mouth every 6 (six) hours as needed. for nausea 08/25/17  Yes [provider]  zolpidem (AMBIEN) 10 MG tablet Take 10 mg by mouth at bedtime as needed for sleep.   Yes [provider]    methocarbamol (ROBAXIN) 500 MG tablet Take 500 mg by mouth 4 (four) times daily. 11/24/17   [provider]  oxyCODONE (OXY IR/ROXICODONE) 5 MG immediate release tablet Take 1-2 tablets by mouth every 4 (four) hours as needed. 11/24/17   [provider]    Review of Systems:  Constitutional:  No weight loss, night sweats, Fevers, chills Head&Eyes: No headache.  No vision loss. ENT:  No Difficulty swallowing,Tooth/dental problems,Sore throat,  No ear ache, post nasal drip,  Cardio-vascular:  No chest pain, Orthopnea, PND, swelling in lower extremities,  dizziness, palpitations  GI:  Nodiarrhea, loss of appetite, hematochezia, melena, heartburn, indigestion, Resp:  No shortness of breath with exertion or at rest. No cough. No coughing up of blood .No wheezing.No chest wall deformity  Skin:  no rash or lesions.  GU:  no dysuria, change in color of urine, no urgency or frequency. No flank pain.  Musculoskeletal:  No joint pain or swelling. No decreased range of motion. No back pain.  Psych:  No change in mood or affect. No depression or anxiety. Neurologic: No headache, no dysesthesia, no focal weakness, no vision loss. No syncope  Physical Exam: Vitals:   12/16/17 1200 12/16/17 1330 12/16/17 1400 12/16/17 1405  BP: 107/65 124/72 137/85   Pulse: 64 77 85   Resp: 13 16 20    Temp:    98.3 F (36.8 C)  TempSrc:    Temporal  SpO2: 100% 98% 99%    General:  A&O x 2, NAD, nontoxic, pleasant/cooperative Head/Eye: No conjunctival hemorrhage, no icterus, [place]/AT, No nystagmus ENT:  No icterus,  No thrush, good dentition, no pharyngeal exudate Neck:  No masses, no lymphadenpathy, no bruits CV:  RRR, no rub, no gallop, no S3 Lung: Diminished breath sounds at the bases.  Bibasilar crackles but no wheezing.  Good air movement. Abdomen: soft/NT, +BS, nondistended, no peritoneal signs Ext: No cyanosis, No rashes, No petechiae, No lymphangitis, No edema  Labs on Admission:   Basic Metabolic Panel: Recent Labs  Lab 12/16/17 1117  NA 139  K 4.5  CL 107  CO2 21*  GLUCOSE 124*  BUN 20  CREATININE 0.89  CALCIUM 7.6*   Liver Function Tests: Recent Labs  Lab 12/16/17 1117  AST 25  ALT 25  ALKPHOS 116  BILITOT 1.0  PROT 6.2*  ALBUMIN 3.1*   No results for input(s): LIPASE, AMYLASE in the last 168 hours. No results for input(s): AMMONIA in the last 168 hours. CBC: Recent Labs  Lab 12/16/17 1117  WBC 5.7  HGB 11.0*  HCT 35.5*  MCV 94.9  PLT 161   Coagulation Profile: No results for input(s): INR, PROTIME in the last 168 hours. Cardiac Enzymes: No results for input(s): CKTOTAL, CKMB, CKMBINDEX, TROPONINI in the last 168 hours. BNP: Invalid input(s): POCBNP CBG: No results for input(s): GLUCAP in the last 168 hours. Urine analysis:    Component Value Date/Time   COLORURINE STRAW (A) 12/16/2017 1212   APPEARANCEUR CLEAR 12/16/2017 1212   LABSPEC 1.015 12/16/2017 1212   PHURINE 7.0 12/16/2017 1212   GLUCOSEU NEGATIVE 12/16/2017 1212   HGBUR NEGATIVE 12/16/2017 1212   BILIRUBINUR NEGATIVE 12/16/2017 1212   KETONESUR 5 (A) 12/16/2017 1212   PROTEINUR NEGATIVE 12/16/2017 1212   NITRITE NEGATIVE 12/16/2017 1212   LEUKOCYTESUR NEGATIVE 12/16/2017 1212   Sepsis Labs: @LABRCNTIP (procalcitonin:4,lacticidven:4) )No results found for this or any previous visit (from the past 240 hour(s)).   Radiological Exams on Admission: Ct Head Wo Contrast  Result Date: 12/16/2017 CLINICAL DATA:  Altered level of consciousness. Weakness, diaphoresis, and vomiting. Recent chemotherapy. EXAM: CT HEAD WITHOUT CONTRAST TECHNIQUE: Contiguous axial images were obtained from the base of the skull through the vertex without intravenous contrast. COMPARISON:  None. FINDINGS: Brain: There is no evidence of acute infarct, intracranial hemorrhage, mass, midline shift, or extra-axial fluid collection. The ventricles and sulci are normal. Vascular: Calcified  atherosclerosis at the skull base. No hyperdense vessel. Skull: No fracture or focal osseous lesion. Sinuses/Orbits: Visualized paranasal sinuses and mastoid air cells are clear. Left cataract extraction is noted. Other: None. IMPRESSION: Unremarkable CT appearance of the brain. Electronically Signed   By: Logan Bores M.D.   On: 12/16/2017 11:18        Time spent:60 minutes Code Status:   DNR Family Communication:  Daughter updated at bedside 6/25 Disposition Plan: expect 1-2 day hospitalization Consults called: none DVT Prophylaxis: Orick Lovenox  Orson Eva, DO  [facility] Pager [phone]  If 7PM-7AM, please contact night-coverage [email or web address] Password  [email or web address] 12/16/2017, 2:42 PM

## 2017-12-16 NOTE — Progress Notes (Signed)
Patient presented today for post chemotherapy fluids.  To receive 2 liters of NS over 2 hours.   Upon arrival patient is lethargic, Disoriented and semi non responsive.  Daughter states that he received 5FU yesterday at [facility] and has an infusion of Cisplatin to distal Port. States the woke up around 1 am vomithing and somewhat disoriented and has progressively worsened. VS wnl.  NS started, as ordered.  Report called to Lana Fish, RN in the ED, as patient needs more intensive therapy and physician assessment.  Taken to room 3 in the ED.  TC made to Arbutus Ped, NP for Dr. Jimmy Footman.  Order taken to discontinue Cisplatin.  Disconnected from port and flushed as documented.  Family will return device to [facility], as they have done in the past.  Dr. Ashok Cordia also spoke to [facility] regarding plan.

## 2017-12-17 ENCOUNTER — Encounter (HOSPITAL_COMMUNITY): Payer: 59

## 2017-12-17 DIAGNOSIS — G9341 Metabolic encephalopathy: Secondary | ICD-10-CM | POA: Diagnosis not present

## 2017-12-17 LAB — COMPREHENSIVE METABOLIC PANEL WITH GFR
ALT: 22 U/L (ref 0–44)
AST: 16 U/L (ref 15–41)
Albumin: 2.9 g/dL — ABNORMAL LOW (ref 3.5–5.0)
Alkaline Phosphatase: 104 U/L (ref 38–126)
Anion gap: 7 (ref 5–15)
BUN: 28 mg/dL — ABNORMAL HIGH (ref 6–20)
CO2: 23 mmol/L (ref 22–32)
Calcium: 7.1 mg/dL — ABNORMAL LOW (ref 8.9–10.3)
Chloride: 112 mmol/L — ABNORMAL HIGH (ref 98–111)
Creatinine, Ser: 0.86 mg/dL (ref 0.61–1.24)
GFR calc Af Amer: >60 mL/min
GFR calc non Af Amer: >60 mL/min
Glucose, Bld: 126 mg/dL — ABNORMAL HIGH (ref 70–99)
Potassium: 3.6 mmol/L (ref 3.5–5.1)
Sodium: 142 mmol/L (ref 135–145)
Total Bilirubin: 0.8 mg/dL (ref 0.3–1.2)
Total Protein: 5.8 g/dL — ABNORMAL LOW (ref 6.5–8.1)

## 2017-12-17 LAB — CBC
HCT: 33.8 % — ABNORMAL LOW (ref 39.0–52.0)
Hemoglobin: 10.6 g/dL — ABNORMAL LOW (ref 13.0–17.0)
MCH: 29.9 pg (ref 26.0–34.0)
MCHC: 31.4 g/dL (ref 30.0–36.0)
MCV: 95.2 fL (ref 78.0–100.0)
Platelets: 180 10*3/uL (ref 150–400)
RBC: 3.55 MIL/uL — ABNORMAL LOW (ref 4.22–5.81)
RDW: 16.6 % — ABNORMAL HIGH (ref 11.5–15.5)
WBC: 5.1 10*3/uL (ref 4.0–10.5)

## 2017-12-17 LAB — FOLATE: Folate: >100 ng/mL

## 2017-12-17 MED ORDER — HEPARIN SOD (PORK) LOCK FLUSH 100 UNIT/ML IV SOLN
500.0000 [IU] | INTRAVENOUS | Status: AC | PRN
  Administered 2017-12-17: 500 [IU]
  Filled 2017-12-17: qty 5

## 2017-12-17 MED ORDER — HEPARIN SOD (PORK) LOCK FLUSH 100 UNIT/ML IV SOLN: 10.0000 [IU] | Freq: Once | INTRAVENOUS | Status: DC

## 2017-12-17 NOTE — Progress Notes (Signed)
Discharge instructions gone over with patient, verbalized understanding. Port deaccessed, patient tolerated procedure well.

## 2017-12-17 NOTE — Discharge Summary (Signed)
Physician Discharge Summary  Robert Mack VEH:[id number] DOB: [birth year] DOA: 12/16/2017  PCP: Robert Percy, MD  Admit date: 12/16/2017 Discharge date: 12/17/2017  Time spent: 45 minutes  Recommendations for Outpatient Follow-up:  -To be discharged home today. -Advised to follow-up with PCP and oncology as scheduled.  Discharge Diagnoses:  Active Problems:   HTN (hypertension)   Intractable nausea and vomiting   Acute metabolic encephalopathy   Metastatic cholangiocarcinoma to bone [facility])   Discharge Condition: Stable and improved  Filed Weights   12/16/17 1735  Weight: 92.7 kg (204 lb 5.9 oz)    History of present illness:  As per Dr. Carles Mack on 6/25: Robert Mack is a 57 y.o. 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  The patient had been in his usual state of health until 12/15/17 when he received his last chemotherapy at [facility].  He is currently on FOLFOX, and has had a similar presentation with hospitalization after his last chemotherapy.  He was admitted to [facility] on 11/27/17 to 11/28/17 when he improved with IVF after suffering intractable N/V.  His chemo dose was reduced, but he again began having N/V as discussed above.  He denies any new meds.  He has not taken more oxycodone tabs.  He was started on olanzapine to help his n/v which he started on 6/23.  He denies cp, sob, HA, visual disturbance, abd pain, dysuria, abd pain.  His family noted that the patient was more lethargic and having worsening generalized weakness on the morning of 12/16/2017.  As result, the patient was brought to emergency department for further evaluation.  In the emergency department, the patient was afebrile hemodynamically stable letter 100% on room air.  BMP, LFTs, and CBC were essentially unremarkable with his hemoglobin at baseline.  CT of the brain was  unremarkable.  Patient was given 2 L of normal saline with some improvement in his mental status.  The patient was admitted for further evaluation.    Hospital Course:   Acute metabolic encephalopathy -Thought due to to side effects of Zyprexa that were started on 6/23, presumably as an off label indication for severe intractable emesis associated with chemotherapy. -Dehydration also playing a role given his excessive emesis after chemo. -Patient currently is at his baseline mental state, this is confirmed by a family friend who is at bedside. -Patient is anxious for discharge home today. -Have advised cessation of Zyprexa at this time.  -Okay to resume OxyContin as he has been on this medication long-term.  Metastatic cholangiocarcinoma -Followed by [facility] oncology department. -To continue metoprolol.  Procedures:  None  Consultations:  None  Discharge Instructions  Discharge Instructions    Diet - low sodium heart healthy   Complete by:  As directed    Increase activity slowly   Complete by:  As directed      Allergies as of 12/17/2017      Reactions   Shellfish Allergy Anaphylaxis, Swelling   Baclofen Nausea And Vomiting   Tramadol Nausea And Vomiting      Medication List    STOP taking these medications   OLANZapine 5 MG tablet Commonly known as:  ZYPREXA     TAKE these medications   aspirin EC 81 MG tablet Take 81 mg by mouth daily.   dexamethasone 4 MG tablet Commonly known as:  DECADRON  Take 2 tablets by mouth daily as needed. On day 2 and 3 after chemotherapy   LORazepam 0.5 MG tablet Commonly known as:  ATIVAN Take 0.5 mg by mouth every 6 (six) hours as needed. for nausea   methocarbamol 500 MG tablet Commonly known as:  ROBAXIN Take 500 mg by mouth 4 (four) times daily.   metoprolol succinate 25 MG 24 hr tablet Commonly known as:  TOPROL XL Take 0.5 tablets (12.5 mg total) by mouth 2 (two) times  daily. What changed:    how much to take  when to take this   omeprazole 20 MG capsule Commonly known as:  PRILOSEC Take 20 mg by mouth daily as needed.   ondansetron 8 MG tablet Commonly known as:  ZOFRAN Take 8 mg by mouth every 8 (eight) hours as needed.   OXYCONTIN 10 mg 12 hr tablet Generic drug:  oxyCODONE Take 1 tablet by mouth 2 (two) times daily.   oxyCODONE 5 MG immediate release tablet Commonly known as:  Oxy IR/ROXICODONE Take 1-2 tablets by mouth every 4 (four) hours as needed.   potassium chloride 8 MEQ tablet Commonly known as:  KLOR-CON Take 1 tablet by mouth 2 (two) times daily.   prochlorperazine 5 MG tablet Commonly known as:  COMPAZINE Take 5 mg by mouth every 6 (six) hours as needed. for nausea   zolpidem 10 MG tablet Commonly known as:  AMBIEN Take 10 mg by mouth at bedtime as needed for sleep.      Allergies  Allergen Reactions  . Shellfish Allergy Anaphylaxis and Swelling  . Baclofen Nausea And Vomiting  . Tramadol Nausea And Vomiting   Follow-up Information    Robert Percy, MD. Schedule an appointment as soon as possible for a visit in 2 week(s).   Specialty:  Family Medicine Contact information: Robert Mack [id number] [phone]            The results of significant diagnostics from this hospitalization (including imaging, microbiology, ancillary and laboratory) are listed below for reference.    Significant Diagnostic Studies: Ct Head Wo Contrast  Result Date: 12/16/2017 CLINICAL DATA:  Altered level of consciousness. Weakness, diaphoresis, and vomiting. Recent chemotherapy. EXAM: CT HEAD WITHOUT CONTRAST TECHNIQUE: Contiguous axial images were obtained from the base of the skull through the vertex without intravenous contrast. COMPARISON:  None. FINDINGS: Brain: There is no evidence of acute infarct, intracranial hemorrhage, mass, midline shift, or extra-axial fluid collection. The ventricles and sulci are normal.  Vascular: Calcified atherosclerosis at the skull base. No hyperdense vessel. Skull: No fracture or focal osseous lesion. Sinuses/Orbits: Visualized paranasal sinuses and mastoid air cells are clear. Left cataract extraction is noted. Other: None. IMPRESSION: Unremarkable CT appearance of the brain. Electronically Signed   By: Logan Bores M.D.   On: 12/16/2017 11:18   Dg Abd Acute W/chest  Result Date: 11/27/2017 CLINICAL DATA:  Nausea and vomiting, metastatic cancer of the gallbladder EXAM: DG ABDOMEN ACUTE W/ 1V CHEST COMPARISON:  Chest radiograph 08/08/2016 FINDINGS: RIGHT jugular portacath with tip over SVC. Normal heart size post mediastinum. Mediastinal contours and pulmonary vascularity normal. Complete atelectasis of RIGHT middle lobe. Small RIGHT pleural effusion. LEFT lung clear. No pneumothorax. Nonobstructive bowel gas pattern. No bowel dilatation, bowel wall thickening or free air. Stool in rectum. Bones intact with postsurgical changes of lower lumbar fusion. IMPRESSION: Complete atelectasis RIGHT middle lobe with small RIGHT pleural effusion. No acute abdominal findings. Electronically Signed   By: Lavonia Dana  M.D.   On: 11/27/2017 13:50    Microbiology: No results found for this or any previous visit (from the past 240 hour(s)).   Labs: Basic Metabolic Panel: Recent Labs  Lab 12/16/17 1117 12/17/17 0610  NA 139 142  K 4.5 3.6  CL 107 112*  CO2 21* 23  GLUCOSE 124* 126*  BUN 20 28*  CREATININE 0.89 0.86  CALCIUM 7.6* 7.1*   Liver Function Tests: Recent Labs  Lab 12/16/17 1117 12/17/17 0610  AST 25 16  ALT 25 22  ALKPHOS 116 104  BILITOT 1.0 0.8  PROT 6.2* 5.8*  ALBUMIN 3.1* 2.9*   No results for input(s): LIPASE, AMYLASE in the last 168 hours. No results for input(s): AMMONIA in the last 168 hours. CBC: Recent Labs  Lab 12/16/17 1117 12/17/17 0610  WBC 5.7 5.1  HGB 11.0* 10.6*  HCT 35.5* 33.8*  MCV 94.9 95.2  PLT 161 180   Cardiac Enzymes: No results  for input(s): CKTOTAL, CKMB, CKMBINDEX, TROPONINI in the last 168 hours. BNP: BNP (last 3 results) No results for input(s): BNP in the last 8760 hours.  ProBNP (last 3 results) No results for input(s): PROBNP in the last 8760 hours.  CBG: No results for input(s): GLUCAP in the last 168 hours.     Signed:  Lelon Frohlich  [facility] Pager: [phone] 12/17/2017, 12:50 PM

## 2017-12-18 ENCOUNTER — Encounter (HOSPITAL_COMMUNITY): Payer: Self-pay

## 2017-12-18 ENCOUNTER — Encounter (HOSPITAL_COMMUNITY)
Admission: RE | Admit: 2017-12-18 | Discharge: 2017-12-18 | Disposition: A | Discharge status: Home / Self Care | Payer: 59 | Source: Ambulatory Visit | Attending: Hematology and Oncology | Admitting: Hematology and Oncology

## 2017-12-18 DIAGNOSIS — C221 Intrahepatic bile duct carcinoma: Secondary | ICD-10-CM | POA: Diagnosis not present

## 2017-12-18 MED ORDER — HEPARIN SOD (PORK) LOCK FLUSH 100 UNIT/ML IV SOLN: 500.0000 [IU] | Freq: Once | INTRAVENOUS | Status: DC

## 2017-12-18 MED ORDER — SODIUM CHLORIDE 0.9 % IV SOLN
INTRAVENOUS | Status: AC
  Administered 2017-12-18 (×2): via INTRAVENOUS

## 2017-12-18 MED ORDER — PROCHLORPERAZINE EDISYLATE 10 MG/2ML IJ SOLN
5.0000 mg | Freq: Once | INTRAMUSCULAR | Status: DC | PRN
  Filled 2017-12-18: qty 1

## 2017-12-19 ENCOUNTER — Encounter (HOSPITAL_COMMUNITY)
Admission: RE | Admit: 2017-12-19 | Discharge: 2017-12-19 | Disposition: A | Discharge status: Home / Self Care | Payer: 59 | Source: Ambulatory Visit | Attending: Hematology and Oncology | Admitting: Hematology and Oncology

## 2017-12-19 ENCOUNTER — Encounter (HOSPITAL_COMMUNITY): Payer: Self-pay

## 2017-12-19 DIAGNOSIS — C221 Intrahepatic bile duct carcinoma: Secondary | ICD-10-CM | POA: Diagnosis not present

## 2017-12-19 MED ORDER — SODIUM CHLORIDE 0.9 % IV SOLN
INTRAVENOUS | Status: AC
  Administered 2017-12-19 (×2): via INTRAVENOUS

## 2017-12-19 MED ORDER — HEPARIN SOD (PORK) LOCK FLUSH 100 UNIT/ML IV SOLN
500.0000 [IU] | Freq: Once | INTRAVENOUS | Status: AC
  Administered 2017-12-19: 500 [IU] via INTRAVENOUS

## 2017-12-19 NOTE — Progress Notes (Signed)
Addendum correction to 12/16/2017 Infusion that was discontinued was 5FU, rather than Cisplatin.

## 2017-12-30 ENCOUNTER — Encounter (HOSPITAL_COMMUNITY)
Admission: RE | Admit: 2017-12-30 | Discharge: 2017-12-30 | Disposition: A | Discharge status: Home / Self Care | Payer: 59 | Source: Ambulatory Visit | Attending: Hematology and Oncology | Admitting: Hematology and Oncology

## 2017-12-30 DIAGNOSIS — C221 Intrahepatic bile duct carcinoma: Secondary | ICD-10-CM | POA: Diagnosis present

## 2017-12-30 MED ORDER — SODIUM CHLORIDE 0.9% FLUSH: 10.0000 mL | INTRAVENOUS | Status: DC | PRN

## 2017-12-30 MED ORDER — SODIUM CHLORIDE 0.9 % IV SOLN
INTRAVENOUS | Status: DC
  Administered 2017-12-30: 11:00:00 via INTRAVENOUS
  Administered 2017-12-30: 1000 mL via INTRAVENOUS

## 2017-12-30 MED ORDER — HEPARIN SOD (PORK) LOCK FLUSH 100 UNIT/ML IV SOLN
500.0000 [IU] | INTRAVENOUS | Status: AC | PRN
  Administered 2017-12-30: 500 [IU]

## 2017-12-31 ENCOUNTER — Encounter (HOSPITAL_COMMUNITY): Payer: Self-pay

## 2017-12-31 ENCOUNTER — Encounter (HOSPITAL_COMMUNITY)
Admission: RE | Admit: 2017-12-31 | Discharge: 2017-12-31 | Disposition: A | Discharge status: Home / Self Care | Payer: 59 | Source: Ambulatory Visit | Attending: Hematology and Oncology | Admitting: Hematology and Oncology

## 2017-12-31 DIAGNOSIS — C221 Intrahepatic bile duct carcinoma: Secondary | ICD-10-CM | POA: Diagnosis not present

## 2017-12-31 MED ORDER — SODIUM CHLORIDE 0.9 % IV SOLN
Freq: Once | INTRAVENOUS | Status: AC
  Administered 2017-12-31: 11:00:00 via INTRAVENOUS

## 2017-12-31 MED ORDER — SODIUM CHLORIDE 0.9 % IV SOLN
Freq: Once | INTRAVENOUS | Status: AC
  Administered 2017-12-31: 10:00:00 via INTRAVENOUS

## 2017-12-31 MED ORDER — PROCHLORPERAZINE EDISYLATE 10 MG/2ML IJ SOLN
5.0000 mg | Freq: Once | INTRAMUSCULAR | Status: DC | PRN
  Filled 2017-12-31: qty 1

## 2017-12-31 MED ORDER — SODIUM CHLORIDE 0.9% FLUSH
10.0000 mL | INTRAVENOUS | Status: AC | PRN
  Administered 2017-12-31: 10 mL
  Filled 2017-12-31: qty 10

## 2017-12-31 MED ORDER — HEPARIN SOD (PORK) LOCK FLUSH 100 UNIT/ML IV SOLN
500.0000 [IU] | INTRAVENOUS | Status: AC | PRN
  Administered 2017-12-31: 500 [IU]
  Filled 2017-12-31: qty 5

## 2018-01-01 ENCOUNTER — Encounter (HOSPITAL_COMMUNITY): Payer: 59

## 2018-01-02 ENCOUNTER — Encounter (HOSPITAL_COMMUNITY): Payer: 59

## 2018-01-05 ENCOUNTER — Encounter: Payer: Self-pay | Admitting: *Deleted

## 2018-01-06 ENCOUNTER — Ambulatory Visit: Payer: 59 | Admitting: Cardiovascular Disease

## 2018-01-06 ENCOUNTER — Encounter: Payer: Self-pay | Admitting: Cardiovascular Disease

## 2018-01-06 VITALS — BP 134/80 | HR 79 | Ht 72.0 in | Wt 207.0 lb

## 2018-01-06 DIAGNOSIS — I10 Essential (primary) hypertension: Secondary | ICD-10-CM

## 2018-01-06 DIAGNOSIS — I25708 Atherosclerosis of coronary artery bypass graft(s), unspecified, with other forms of angina pectoris: Secondary | ICD-10-CM

## 2018-01-06 DIAGNOSIS — I471 Supraventricular tachycardia: Secondary | ICD-10-CM

## 2018-01-06 DIAGNOSIS — E785 Hyperlipidemia, unspecified: Secondary | ICD-10-CM

## 2018-01-06 NOTE — Patient Instructions (Signed)

## 2018-01-06 NOTE — Progress Notes (Signed)
SUBJECTIVE: Mr. Antigua presents for routine follow-up.  He was diagnosed with cholangiocarcinoma in December 2016 and was given a year to live.  He was recently hospitalized for intractable nausea and vomiting with acute metabolic encephalopathy.  He was given IV fluids and Zyprexa was discontinued.  He was on FOLFOX therapy.  This was recently changed last week. He has diffuse skeletal metastases.  Today he is doing well and denies chest pain, palpitations, leg swelling, and shortness of breath.  He has a history of coronary artery disease and CABG in 2010, essential hypertension, hyperlipidemia, PSVT, and sleep apnea. He also has a history of postoperative atrial fibrillation. He underwent a stress echocardiogram in April 2014 which was negative for inducible ischemia. He had normal left ventricular systolic function, exercised for 7 minutes, and achieved 10 METs.       Review of Systems: As per "subjective", otherwise negative.  Allergies  Allergen Reactions  . Shellfish Allergy Anaphylaxis and Swelling  . Baclofen Nausea And Vomiting  . Olanzapine Other (See Comments)    Extreme lethargy, Extreme altered mental status.  . Tramadol Nausea And Vomiting    Current Outpatient Medications  Medication Sig Dispense Refill  . aspirin EC 81 MG tablet Take 81 mg by mouth daily.    Marland Kitchen dexamethasone (DECADRON) 1 MG tablet Take 1 tablet by mouth daily.    Marland Kitchen dexamethasone (DECADRON) 4 MG tablet Take 2 tablets by mouth daily as needed. On day 2 and 3 after chemotherapy    . LORazepam (ATIVAN) 0.5 MG tablet Take 0.5 mg by mouth every 6 (six) hours as needed. for nausea  1  . methocarbamol (ROBAXIN) 500 MG tablet Take 500 mg by mouth 4 (four) times daily.  0  . metoprolol succinate (TOPROL-XL) 25 MG 24 hr tablet Take 25 mg by mouth daily.    . ondansetron (ZOFRAN) 8 MG tablet Take 8 mg by mouth every 8 (eight) hours as needed.  2  . oxyCODONE (OXY IR/ROXICODONE) 5 MG immediate release  tablet Take 1-2 tablets by mouth every 4 (four) hours as needed.  0  . OXYCONTIN 10 MG 12 hr tablet Take 1 tablet by mouth 2 (two) times daily.  0  . prochlorperazine (COMPAZINE) 5 MG tablet Take 5 mg by mouth every 6 (six) hours as needed. for nausea  0  . ranitidine (ZANTAC) 150 MG tablet Take 150 mg by mouth daily as needed for heartburn.    . zolpidem (AMBIEN) 10 MG tablet Take 10 mg by mouth at bedtime as needed for sleep.     No current facility-administered medications for this visit.     Past Medical History:  Diagnosis Date  . Anxiety   . Atrial fibrillation (HCC)    postoperatively  . CAD (coronary artery disease)    status post coronary artery bypass grafting 2010 with normal LV function  . Cancer ([place])    bone cancer  . HLD (hyperlipidemia)   . HTN (hypertension)    borderline controlled  . Sleep apnea     Past Surgical History:  Procedure Laterality Date  . BILIARY STENT PLACEMENT N/A 05/24/2015   Procedure: BILIARY STENT PLACEMENT;  Surgeon: Rogene Houston, MD;  Location: [facility];  Service: Gastroenterology;  Laterality: N/A;  . CARDIAC CATHETERIZATION  2006, 2010   left heart  . CATARACT EXTRACTION Left 2014  . CHOLECYSTECTOMY    . CORONARY ARTERY BYPASS GRAFT  2010   x3  . ERCP N/A  05/24/2015   Procedure: ENDOSCOPIC RETROGRADE CHOLANGIOPANCREATOGRAPHY (ERCP);  Surgeon: Rogene Houston, MD;  Location: [facility];  Service: Gastroenterology;  Laterality: N/A;  . LUMBAR FUSION N/A 2016  . SPHINCTEROTOMY N/A 05/24/2015   Procedure: SPHINCTEROTOMY;  Surgeon: Rogene Houston, MD;  Location: [facility];  Service: Gastroenterology;  Laterality: N/A;    Social History   Socioeconomic History  . Marital status: Married    Spouse name: Not on file  . Number of children: Not on file  . Years of education: Not on file  . Highest education level: Not on file  Occupational History  . Not on file  Social Needs  . Financial resource strain: Not on file  . Food insecurity:      Worry: Not on file    Inability: Not on file  . Transportation needs:    Medical: Not on file    Non-medical: Not on file  Tobacco Use  . Smoking status: Former Smoker    Packs/day: 0.25    Years: 17.00    Pack years: 4.25    Types: Cigarettes    Start date: 06/25/1971    Last attempt to quit: 06/24/1988    Years since quitting: 29.5  . Smokeless tobacco: Never Used  . Tobacco comment: quit 1990  Substance and Sexual Activity  . Alcohol use: Yes    Alcohol/week: 0.0 oz    Comment: Socially-beer  . Drug use: No  . Sexual activity: Not on file  Lifestyle  . Physical activity:    Days per week: Not on file    Minutes per session: Not on file  . Stress: Not on file  Relationships  . Social connections:    Talks on phone: Not on file    Gets together: Not on file    Attends religious service: Not on file    Active member of club or organization: Not on file    Attends meetings of clubs or organizations: Not on file    Relationship status: Not on file  . Intimate partner violence:    Fear of current or ex partner: Not on file    Emotionally abused: Not on file    Physically abused: Not on file    Forced sexual activity: Not on file  Other Topics Concern  . Not on file  Social History Narrative  . Not on file     Vitals:   01/06/18 0815  BP: 134/80  Pulse: 79  SpO2: 100%  Weight: 207 lb (93.9 kg)  Height: 6' (1.829 m)    Wt Readings from Last 3 Encounters:  01/06/18 207 lb (93.9 kg)  12/31/17 204 lb (92.5 kg)  12/16/17 204 lb 5.9 oz (92.7 kg)     PHYSICAL EXAM General: NAD HEENT: Normal. Neck: No JVD, no thyromegaly. Lungs: Clear to auscultation bilaterally with normal respiratory effort. CV: Regular rate and rhythm, normal S1/S2, no S3/S4, no murmur. No pretibial or periankle edema.  No carotid bruit.   Abdomen: Soft, nontender, no distention.  Neurologic: Alert and oriented.  Psych: Normal affect. Skin: Normal. Musculoskeletal: No gross  deformities.    ECG: Reviewed above under Subjective   Labs:    Lipids: Lab Results  Component Value Date/Time   [facility]  08/10/2008 04:07 AM    67        Total Cholesterol/HDL:CHD Risk Coronary Heart Disease Risk Table  Men   Women  1/2 Average Risk   3.4   3.3  Average Risk       5.0   4.4  2 X Average Risk   9.6   7.1  3 X Average Risk  23.4   11.0        Use the calculated Patient Ratio above and the CHD Risk Table to determine the patient's CHD Risk.        ATP III CLASSIFICATION (LDL):  <100     mg/dL   Optimal  100-129  mg/dL   Near or Above                    Optimal  130-159  mg/dL   Borderline  160-189  mg/dL   High  >190     mg/dL   Very High   [facility]  08/10/2008 04:07 AM    123        ATP III CLASSIFICATION:  <200     mg/dL   Desirable  200-239  mg/dL   Borderline High  >=240    mg/dL   High          TRIG 176 (H) 08/10/2008 04:07 AM   HDL 21 (L) 08/10/2008 04:07 AM       ASSESSMENT AND PLAN: 1.  Coronary disease with history of CABG: Symptomatically stable.  Normal stress echocardiogram in April 2014.  Continue aspirin and beta-blocker.  Not on statin due to elevated transaminases in the past.  2.  Hypertension: Controlled on present therapy.  No changes.  3.  Hyperlipidemia: Not on statin due to elevated transaminases in the past.  4.  PSVT: Symptomatically stable on beta-blocker.  No changes.    Disposition: Follow up 1 yr  Kate Sable, M.D., F.A.C.C.

## 2018-01-13 ENCOUNTER — Encounter (HOSPITAL_COMMUNITY)
Admission: RE | Admit: 2018-01-13 | Discharge: 2018-01-13 | Disposition: A | Discharge status: Home / Self Care | Payer: 59 | Source: Ambulatory Visit | Attending: Hematology and Oncology | Admitting: Hematology and Oncology

## 2018-01-13 ENCOUNTER — Encounter (HOSPITAL_COMMUNITY): Payer: Self-pay

## 2018-01-13 DIAGNOSIS — C221 Intrahepatic bile duct carcinoma: Secondary | ICD-10-CM | POA: Diagnosis not present

## 2018-01-13 MED ORDER — SODIUM CHLORIDE 0.9% FLUSH
10.0000 mL | INTRAVENOUS | Status: DC
  Administered 2018-01-13: 10 mL via INTRAVENOUS

## 2018-01-13 MED ORDER — HEPARIN SOD (PORK) LOCK FLUSH 100 UNIT/ML IV SOLN
500.0000 [IU] | Freq: Once | INTRAVENOUS | Status: AC
  Administered 2018-01-13: 500 [IU] via INTRAVENOUS

## 2018-01-13 MED ORDER — SODIUM CHLORIDE 0.9 % IV SOLN
INTRAVENOUS | Status: AC
  Administered 2018-01-13 (×2): via INTRAVENOUS

## 2018-01-14 ENCOUNTER — Encounter (HOSPITAL_COMMUNITY): Payer: Self-pay

## 2018-01-14 ENCOUNTER — Encounter (HOSPITAL_COMMUNITY)
Admission: RE | Admit: 2018-01-14 | Discharge: 2018-01-14 | Disposition: A | Discharge status: Home / Self Care | Payer: 59 | Source: Ambulatory Visit | Attending: Hematology and Oncology | Admitting: Hematology and Oncology

## 2018-01-14 DIAGNOSIS — C221 Intrahepatic bile duct carcinoma: Secondary | ICD-10-CM | POA: Diagnosis not present

## 2018-01-14 MED ORDER — SODIUM CHLORIDE 0.9 % IV SOLN
INTRAVENOUS | Status: AC
  Administered 2018-01-14 (×2): via INTRAVENOUS

## 2018-01-14 MED ORDER — HEPARIN SOD (PORK) LOCK FLUSH 100 UNIT/ML IV SOLN
500.0000 [IU] | Freq: Once | INTRAVENOUS | Status: AC
  Administered 2018-01-14: 500 [IU] via INTRAVENOUS

## 2018-01-15 ENCOUNTER — Encounter (HOSPITAL_COMMUNITY)
Admission: RE | Admit: 2018-01-15 | Discharge: 2018-01-15 | Disposition: A | Discharge status: Home / Self Care | Payer: 59 | Source: Ambulatory Visit | Attending: Hematology and Oncology | Admitting: Hematology and Oncology

## 2018-01-15 DIAGNOSIS — C221 Intrahepatic bile duct carcinoma: Secondary | ICD-10-CM | POA: Diagnosis not present

## 2018-01-15 MED ORDER — HEPARIN SOD (PORK) LOCK FLUSH 100 UNIT/ML IV SOLN
500.0000 [IU] | Freq: Once | INTRAVENOUS | Status: AC
  Administered 2018-01-15: 500 [IU] via INTRAVENOUS

## 2018-01-15 MED ORDER — SODIUM CHLORIDE 0.9% FLUSH
10.0000 mL | INTRAVENOUS | Status: DC
  Administered 2018-01-15: 10 mL

## 2018-01-15 MED ORDER — SODIUM CHLORIDE 0.9 % IV SOLN
INTRAVENOUS | Status: AC
  Administered 2018-01-15 (×2): 1000 mL via INTRAVENOUS

## 2018-01-16 ENCOUNTER — Encounter (HOSPITAL_COMMUNITY)
Admission: RE | Admit: 2018-01-16 | Discharge: 2018-01-16 | Disposition: A | Discharge status: Home / Self Care | Payer: 59 | Source: Ambulatory Visit | Attending: Hematology and Oncology | Admitting: Hematology and Oncology

## 2018-01-27 ENCOUNTER — Encounter (HOSPITAL_COMMUNITY)
Admission: RE | Admit: 2018-01-27 | Discharge: 2018-01-27 | Disposition: A | Discharge status: Home / Self Care | Payer: 59 | Source: Ambulatory Visit | Attending: Hematology and Oncology | Admitting: Hematology and Oncology

## 2018-01-27 ENCOUNTER — Encounter (HOSPITAL_COMMUNITY): Payer: Self-pay

## 2018-01-27 DIAGNOSIS — C221 Intrahepatic bile duct carcinoma: Secondary | ICD-10-CM | POA: Insufficient documentation

## 2018-01-27 MED ORDER — HEPARIN SOD (PORK) LOCK FLUSH 100 UNIT/ML IV SOLN
INTRAVENOUS | Status: AC
  Filled 2018-01-27: qty 5

## 2018-01-27 MED ORDER — HEPARIN SOD (PORK) LOCK FLUSH 100 UNIT/ML IV SOLN
500.0000 [IU] | INTRAVENOUS | Status: AC | PRN
  Administered 2018-01-27: 500 [IU]

## 2018-01-27 MED ORDER — SODIUM CHLORIDE 0.9% FLUSH
10.0000 mL | INTRAVENOUS | Status: AC | PRN
  Administered 2018-01-27: 10 mL

## 2018-01-27 MED ORDER — SODIUM CHLORIDE 0.9% FLUSH
INTRAVENOUS | Status: AC
  Filled 2018-01-27: qty 10

## 2018-01-27 MED ORDER — SODIUM CHLORIDE 0.9 % IV SOLN
INTRAVENOUS | Status: AC
  Administered 2018-01-27 (×2): via INTRAVENOUS

## 2018-01-28 ENCOUNTER — Encounter (HOSPITAL_COMMUNITY)
Admission: RE | Admit: 2018-01-28 | Discharge: 2018-01-28 | Disposition: A | Discharge status: Home / Self Care | Payer: 59 | Source: Ambulatory Visit | Attending: Hematology and Oncology | Admitting: Hematology and Oncology

## 2018-01-28 ENCOUNTER — Encounter (HOSPITAL_COMMUNITY): Payer: Self-pay

## 2018-01-28 DIAGNOSIS — C221 Intrahepatic bile duct carcinoma: Secondary | ICD-10-CM | POA: Diagnosis not present

## 2018-01-28 MED ORDER — HEPARIN SOD (PORK) LOCK FLUSH 100 UNIT/ML IV SOLN
500.0000 [IU] | Freq: Once | INTRAVENOUS | Status: AC
  Administered 2018-01-28: 500 [IU] via INTRAVENOUS

## 2018-01-28 MED ORDER — SODIUM CHLORIDE 0.9 % IV SOLN
INTRAVENOUS | Status: DC
  Administered 2018-01-28: 10:00:00 via INTRAVENOUS

## 2018-01-28 MED ORDER — SODIUM CHLORIDE 0.9 % IV SOLN: Freq: Once | INTRAVENOUS | Status: DC

## 2018-01-28 MED ORDER — SODIUM CHLORIDE 0.9 % IV SOLN
INTRAVENOUS | Status: AC
  Administered 2018-01-28: 11:00:00 via INTRAVENOUS

## 2018-01-29 ENCOUNTER — Encounter (HOSPITAL_COMMUNITY)
Admission: RE | Admit: 2018-01-29 | Discharge: 2018-01-29 | Disposition: A | Discharge status: Home / Self Care | Payer: 59 | Source: Ambulatory Visit | Attending: Hematology and Oncology | Admitting: Hematology and Oncology

## 2018-01-29 ENCOUNTER — Encounter (HOSPITAL_COMMUNITY): Payer: Self-pay

## 2018-01-29 DIAGNOSIS — C221 Intrahepatic bile duct carcinoma: Secondary | ICD-10-CM | POA: Diagnosis not present

## 2018-01-29 LAB — CBC WITH DIFFERENTIAL/PLATELET
BASOS ABS: 0 10*3/uL (ref 0.0–0.1)
Basophils Relative: 0 %
Eosinophils Absolute: 0 10*3/uL (ref 0.0–0.7)
Eosinophils Relative: 0 %
HEMATOCRIT: 21.8 % — AB (ref 39.0–52.0)
HEMOGLOBIN: 7.2 g/dL — AB (ref 13.0–17.0)
Lymphocytes Relative: 2 %
Lymphs Abs: 0.1 10*3/uL — ABNORMAL LOW (ref 0.7–4.0)
MCH: 30 pg (ref 26.0–34.0)
MCHC: 33 g/dL (ref 30.0–36.0)
MCV: 90.8 fL (ref 78.0–100.0)
Monocytes Absolute: 0 10*3/uL — ABNORMAL LOW (ref 0.1–1.0)
Monocytes Relative: 0 %
NEUTROS ABS: 4 10*3/uL (ref 1.7–7.7)
NEUTROS PCT: 98 %
Platelets: 168 10*3/uL (ref 150–400)
RBC: 2.4 MIL/uL — ABNORMAL LOW (ref 4.22–5.81)
RDW: 17.7 % — ABNORMAL HIGH (ref 11.5–15.5)
WBC: 4.1 10*3/uL (ref 4.0–10.5)

## 2018-01-29 LAB — ABO/RH: ABO/RH(D): A POS

## 2018-01-29 LAB — PREPARE RBC (CROSSMATCH)

## 2018-01-29 MED ORDER — SODIUM CHLORIDE 0.9 % IV SOLN: INTRAVENOUS | Status: DC

## 2018-01-29 MED ORDER — SODIUM CHLORIDE 0.9 % IV SOLN
INTRAVENOUS | Status: AC
  Administered 2018-01-29 (×2): via INTRAVENOUS

## 2018-01-29 MED ORDER — HEPARIN SOD (PORK) LOCK FLUSH 100 UNIT/ML IV SOLN
500.0000 [IU] | Freq: Once | INTRAVENOUS | Status: AC
  Administered 2018-01-29: 500 [IU] via INTRAVENOUS

## 2018-01-29 MED ORDER — SODIUM CHLORIDE 0.9% FLUSH
10.0000 mL | INTRAVENOUS | Status: DC
  Administered 2018-01-29: 10 mL via INTRAVENOUS

## 2018-01-30 ENCOUNTER — Encounter (HOSPITAL_COMMUNITY)
Admission: RE | Admit: 2018-01-30 | Discharge: 2018-01-30 | Disposition: A | Discharge status: Home / Self Care | Payer: 59 | Source: Ambulatory Visit | Attending: Hematology and Oncology | Admitting: Hematology and Oncology

## 2018-01-30 ENCOUNTER — Encounter (HOSPITAL_COMMUNITY): Admission: RE | Admit: 2018-01-30 | Payer: 59 | Source: Ambulatory Visit

## 2018-01-30 ENCOUNTER — Encounter (HOSPITAL_COMMUNITY): Payer: Self-pay

## 2018-01-30 DIAGNOSIS — C221 Intrahepatic bile duct carcinoma: Secondary | ICD-10-CM | POA: Diagnosis not present

## 2018-01-30 MED ORDER — SODIUM CHLORIDE 0.9% IV SOLUTION
Freq: Once | INTRAVENOUS | Status: AC
  Administered 2018-01-30: 08:00:00 via INTRAVENOUS

## 2018-01-30 MED ORDER — HEPARIN SOD (PORK) LOCK FLUSH 100 UNIT/ML IV SOLN
500.0000 [IU] | Freq: Once | INTRAVENOUS | Status: AC
  Administered 2018-01-30: 500 [IU] via INTRAVENOUS

## 2018-01-31 LAB — BPAM RBC
Blood Product Expiration Date: 201909062359
Blood Product Expiration Date: 201909062359
ISSUE DATE / TIME: 201908090811
ISSUE DATE / TIME: 201908090953
Unit Type and Rh: 6200
Unit Type and Rh: 6200

## 2018-01-31 LAB — TYPE AND SCREEN
ABO/RH(D): A POS
Antibody Screen: NEGATIVE
UNIT DIVISION: 0
Unit division: 0

## 2018-02-10 ENCOUNTER — Encounter (HOSPITAL_COMMUNITY): Admission: RE | Admit: 2018-02-10 | Payer: 59 | Source: Ambulatory Visit

## 2018-02-11 ENCOUNTER — Encounter (HOSPITAL_COMMUNITY): Payer: 59

## 2018-02-12 ENCOUNTER — Encounter (HOSPITAL_COMMUNITY): Payer: 59

## 2018-02-13 ENCOUNTER — Encounter (HOSPITAL_COMMUNITY): Payer: 59

## 2018-02-17 ENCOUNTER — Encounter (HOSPITAL_COMMUNITY): Payer: Self-pay

## 2018-02-17 ENCOUNTER — Encounter (HOSPITAL_COMMUNITY)
Admission: RE | Admit: 2018-02-17 | Discharge: 2018-02-17 | Disposition: A | Discharge status: Home / Self Care | Payer: 59 | Source: Ambulatory Visit | Attending: Hematology and Oncology | Admitting: Hematology and Oncology

## 2018-02-17 DIAGNOSIS — C221 Intrahepatic bile duct carcinoma: Secondary | ICD-10-CM | POA: Diagnosis not present

## 2018-02-17 MED ORDER — SODIUM CHLORIDE 0.9% FLUSH
10.0000 mL | Freq: Once | INTRAVENOUS | Status: AC
  Administered 2018-02-17: 10 mL

## 2018-02-17 MED ORDER — HEPARIN SOD (PORK) LOCK FLUSH 100 UNIT/ML IV SOLN
500.0000 [IU] | Freq: Once | INTRAVENOUS | Status: AC
  Administered 2018-02-17: 500 [IU]

## 2018-02-17 MED ORDER — SODIUM CHLORIDE 0.9 % IV SOLN
INTRAVENOUS | Status: AC
  Administered 2018-02-17 (×2): via INTRAVENOUS

## 2018-02-18 ENCOUNTER — Encounter (HOSPITAL_COMMUNITY): Payer: Self-pay

## 2018-02-18 ENCOUNTER — Encounter (HOSPITAL_COMMUNITY)
Admission: RE | Admit: 2018-02-18 | Discharge: 2018-02-18 | Disposition: A | Discharge status: Home / Self Care | Payer: 59 | Source: Ambulatory Visit | Attending: Hematology and Oncology | Admitting: Hematology and Oncology

## 2018-02-18 DIAGNOSIS — C221 Intrahepatic bile duct carcinoma: Secondary | ICD-10-CM | POA: Diagnosis not present

## 2018-02-18 MED ORDER — HEPARIN SOD (PORK) LOCK FLUSH 100 UNIT/ML IV SOLN: 500.0000 [IU] | Freq: Once | INTRAVENOUS | Status: DC

## 2018-02-18 MED ORDER — SODIUM CHLORIDE 0.9% FLUSH
10.0000 mL | INTRAVENOUS | Status: DC
  Administered 2018-02-18: 10 mL via INTRAVENOUS

## 2018-02-18 MED ORDER — SODIUM CHLORIDE 0.9 % IV SOLN
INTRAVENOUS | Status: AC
  Administered 2018-02-18 (×2): via INTRAVENOUS

## 2018-02-19 ENCOUNTER — Encounter (HOSPITAL_COMMUNITY)
Admission: RE | Admit: 2018-02-19 | Discharge: 2018-02-19 | Disposition: A | Discharge status: Home / Self Care | Payer: 59 | Source: Ambulatory Visit | Attending: Hematology and Oncology | Admitting: Hematology and Oncology

## 2018-02-19 ENCOUNTER — Encounter (HOSPITAL_COMMUNITY): Payer: Self-pay

## 2018-02-19 DIAGNOSIS — C221 Intrahepatic bile duct carcinoma: Secondary | ICD-10-CM | POA: Diagnosis not present

## 2018-02-19 MED ORDER — SODIUM CHLORIDE 0.9 % IV SOLN
INTRAVENOUS | Status: AC
  Administered 2018-02-19 (×2): via INTRAVENOUS

## 2018-02-19 MED ORDER — HEPARIN SOD (PORK) LOCK FLUSH 100 UNIT/ML IV SOLN
500.0000 [IU] | Freq: Once | INTRAVENOUS | Status: AC
  Administered 2018-02-19: 500 [IU]

## 2018-02-19 MED ORDER — SODIUM CHLORIDE 0.9% FLUSH
10.0000 mL | Freq: Once | INTRAVENOUS | Status: AC
  Administered 2018-02-19: 10 mL

## 2018-02-20 ENCOUNTER — Encounter (HOSPITAL_COMMUNITY)
Admission: RE | Admit: 2018-02-20 | Discharge: 2018-02-20 | Disposition: A | Discharge status: Home / Self Care | Payer: 59 | Source: Ambulatory Visit | Attending: Hematology and Oncology | Admitting: Hematology and Oncology

## 2018-02-20 ENCOUNTER — Encounter (HOSPITAL_COMMUNITY): Payer: Self-pay

## 2018-02-20 DIAGNOSIS — C221 Intrahepatic bile duct carcinoma: Secondary | ICD-10-CM | POA: Diagnosis not present

## 2018-02-20 LAB — HEMOGLOBIN AND HEMATOCRIT, BLOOD
HCT: 23.4 % — ABNORMAL LOW (ref 39.0–52.0)
HEMOGLOBIN: 7.5 g/dL — AB (ref 13.0–17.0)

## 2018-02-20 LAB — PLATELET COUNT: Platelets: 216 10*3/uL (ref 150–400)

## 2018-02-20 MED ORDER — HEPARIN SOD (PORK) LOCK FLUSH 100 UNIT/ML IV SOLN
500.0000 [IU] | Freq: Once | INTRAVENOUS | Status: AC
  Administered 2018-02-20: 500 [IU]

## 2018-02-20 MED ORDER — SODIUM CHLORIDE 0.9% FLUSH
10.0000 mL | Freq: Once | INTRAVENOUS | Status: AC
  Administered 2018-02-20: 10 mL

## 2018-02-20 MED ORDER — SODIUM CHLORIDE 0.9 % IV SOLN
INTRAVENOUS | Status: AC
  Administered 2018-02-20 (×2): via INTRAVENOUS

## 2018-02-20 MED ORDER — SODIUM CHLORIDE 0.9% IV SOLUTION: Freq: Once | INTRAVENOUS | Status: DC

## 2018-02-20 NOTE — Progress Notes (Addendum)
Patient presented for IVF infusion today.  Color pale, SOB and stated that he felt like his hema globin had dropped again.  Spoke with Vena Rua, NP representing Dr Jimmy Footman at [facility] regarding symptoms.  Order received to draw CBC.  HGB 7.5.  Per chemo guidelines he is to receive 2 units of PRBC's.  Order received for Type and screen and cross 2 units to infuse today.  Notified by Venida Jarvis in the lab that patient had developed antibodies from previous transfusion this month, therefore could not receive blood today.  Was able to arrange through Toy Baker, Nursing Supervisor, to have patient arrive through the ER and register at [phone] to have blood transfusion as an outpatient on unit 300.  Consent and order taken to floor.  Patient made aware and verbalized understanding.

## 2018-02-21 ENCOUNTER — Other Ambulatory Visit: Payer: Self-pay

## 2018-02-21 ENCOUNTER — Encounter (HOSPITAL_COMMUNITY): Payer: Self-pay | Admitting: Emergency Medicine

## 2018-02-21 ENCOUNTER — Encounter (HOSPITAL_COMMUNITY)
Admission: RE | Admit: 2018-02-21 | Discharge: 2018-02-21 | Disposition: A | Discharge status: Home / Self Care | Payer: 59 | Source: Ambulatory Visit | Attending: Hematology and Oncology | Admitting: Hematology and Oncology

## 2018-02-21 ENCOUNTER — Emergency Department (HOSPITAL_COMMUNITY)
Admission: EM | Admit: 2018-02-21 | Discharge: 2018-02-21 | Disposition: A | Discharge status: Home / Self Care | Payer: 59

## 2018-02-21 DIAGNOSIS — C221 Intrahepatic bile duct carcinoma: Secondary | ICD-10-CM | POA: Diagnosis not present

## 2018-02-21 LAB — PREPARE RBC (CROSSMATCH)

## 2018-02-21 MED ORDER — SODIUM CHLORIDE 0.9 % IV SOLN
Freq: Once | INTRAVENOUS | Status: AC
  Administered 2018-02-21: 12:00:00 via INTRAVENOUS

## 2018-02-21 MED ORDER — SODIUM CHLORIDE 0.9% FLUSH
10.0000 mL | Freq: Once | INTRAVENOUS | Status: AC
  Administered 2018-02-21: 10 mL

## 2018-02-21 MED ORDER — HEPARIN SOD (PORK) LOCK FLUSH 100 UNIT/ML IV SOLN
500.0000 [IU] | Freq: Once | INTRAVENOUS | Status: AC
  Administered 2018-02-21: 500 [IU] via INTRAVENOUS

## 2018-02-21 NOTE — ED Triage Notes (Signed)
Pt currently receiving chemo at [facility] on Mondays. States T-Fr he receives outpatient fluids here at [facility]. Yesterday, he had labs drawn and Hbg was 7.3. Stated he needs a blood transfusion, but he had antibodies in his blood and it would take a while for blood to come yesterday, so he was told to return today for transfusion. Pt has port that is already accessed.

## 2018-02-21 NOTE — Progress Notes (Signed)
Patient presents to unit at 1030 with orders for two units PRBCs to be administered as an outpatient. Patient's VSS and pt settled into room. Per lab, pt's blood will be coming from [facility] and will be here around 1130, patient made aware. Awaiting blood's arrival at this time. Will continue to monitor.  Celestia Khat, RN

## 2018-02-21 NOTE — ED Notes (Signed)
Pt to be taken to 300 for outpatient transfusion per [facility].

## 2018-02-21 NOTE — Progress Notes (Signed)
UNMATCHED BLOOD PRODUCT NOTE  Compare the patient ID on the blood tag to the patient ID on the hospital armband and Blood Bank armband. Then confirm the unit number on the blood tag matches the unit number on the blood product.  If a discrepancy is discovered return the product to blood bank immediately.   Blood Product Type: Packed Red Blood Cells  Unit #: (Found on blood product bag, begins with W) [id number]  Product Code #: (Found on blood product bag, begins with E) [id number]   Start Time: 1448  Starting Rate: 120 ml/hr  Rate increase/decreased  (if applicable):  634 ml/hr  Rate changed time (if applicable): 9494   Stop Time: 1700   All Other Documentation should be documented within the Blood Admin Flowsheet per policy.

## 2018-02-21 NOTE — Progress Notes (Signed)
Patient's second unit of PRBCs started at 1448. Barcode were not matched, blood verified by this RN and D Inman,RN. See Unmatched Blood Product Note to follow.  Celestia Khat, RN

## 2018-02-21 NOTE — Progress Notes (Signed)
Patient's second unit of blood administration complete. Patient's VSS. Port flushed and deaccessed. Patient awaiting transportation at this time.  Celestia Khat, RN

## 2018-02-22 ENCOUNTER — Encounter (HOSPITAL_COMMUNITY): Payer: 59

## 2018-02-22 LAB — TYPE AND SCREEN
ABO/RH(D): A POS
ANTIBODY SCREEN: POSITIVE
DONOR AG TYPE: NEGATIVE
DONOR AG TYPE: NEGATIVE
UNIT DIVISION: 0
UNIT DIVISION: 0

## 2018-02-22 LAB — BPAM RBC
BLOOD PRODUCT EXPIRATION DATE: 201909192359
Blood Product Expiration Date: 201909192359
ISSUE DATE / TIME: 201908311144
ISSUE DATE / TIME: 201908311144
UNIT TYPE AND RH: 6200
UNIT TYPE AND RH: 6200

## 2018-03-10 ENCOUNTER — Encounter (HOSPITAL_COMMUNITY): Payer: 59

## 2018-03-11 ENCOUNTER — Encounter (HOSPITAL_COMMUNITY): Payer: 59

## 2018-03-12 ENCOUNTER — Encounter (HOSPITAL_COMMUNITY): Payer: 59

## 2018-03-13 ENCOUNTER — Encounter (HOSPITAL_COMMUNITY): Payer: 59

## 2018-03-24 ENCOUNTER — Encounter (HOSPITAL_COMMUNITY)
Admission: RE | Admit: 2018-03-24 | Discharge: 2018-03-24 | Disposition: A | Discharge status: Home / Self Care | Payer: 59 | Source: Ambulatory Visit | Attending: Hematology and Oncology | Admitting: Hematology and Oncology

## 2018-03-25 ENCOUNTER — Encounter (HOSPITAL_COMMUNITY): Payer: 59

## 2018-03-26 ENCOUNTER — Encounter (HOSPITAL_COMMUNITY): Payer: 59

## 2018-03-27 ENCOUNTER — Encounter (HOSPITAL_COMMUNITY): Payer: 59

## 2018-04-24 MED ORDER — METHOCARBAMOL 500 MG PO TABS
500.00 | ORAL_TABLET | ORAL | Status: DC
Start: ? — End: 2018-04-24

## 2018-04-24 MED ORDER — PROCHLORPERAZINE MALEATE 5 MG PO TABS
5.00 | ORAL_TABLET | ORAL | Status: DC
Start: ? — End: 2018-04-24

## 2018-04-24 MED ORDER — ONDANSETRON 4 MG PO TBDP
4.00 | ORAL_TABLET | ORAL | Status: DC
Start: ? — End: 2018-04-24

## 2018-04-24 MED ORDER — ACETAMINOPHEN 325 MG PO TABS
650.00 | ORAL_TABLET | ORAL | Status: DC
Start: ? — End: 2018-04-24

## 2018-04-24 MED ORDER — OXYCODONE HCL 5 MG PO TABS
10.00 | ORAL_TABLET | ORAL | Status: DC
Start: ? — End: 2018-04-24

## 2018-04-24 MED ORDER — OXYCODONE HCL ER 20 MG PO T12A
20.00 | EXTENDED_RELEASE_TABLET | ORAL | Status: DC
Start: 2018-04-24 — End: 2018-04-24

## 2018-04-24 MED ORDER — LACTATED RINGERS IV SOLN
INTRAVENOUS | Status: DC
Start: ? — End: 2018-04-24

## 2018-04-24 MED ORDER — PANTOPRAZOLE SODIUM 40 MG PO TBEC
40.00 | DELAYED_RELEASE_TABLET | ORAL | Status: DC
Start: 2018-04-29 — End: 2018-04-24

## 2018-04-24 MED ORDER — GABAPENTIN 300 MG PO CAPS
600.00 | ORAL_CAPSULE | ORAL | Status: DC
Start: 2018-04-29 — End: 2018-04-24

## 2018-04-29 MED ORDER — METOCLOPRAMIDE HCL 5 MG/ML IJ SOLN
10.00 | INTRAMUSCULAR | Status: DC
Start: ? — End: 2018-04-29

## 2018-04-29 MED ORDER — ONDANSETRON HCL 4 MG/2ML IJ SOLN
4.00 | INTRAMUSCULAR | Status: DC
Start: ? — End: 2018-04-29

## 2018-04-29 MED ORDER — FENTANYL 100 MCG/HR TD PT72
1.00 | MEDICATED_PATCH | TRANSDERMAL | Status: DC
Start: 2018-04-30 — End: 2018-04-29

## 2018-04-29 MED ORDER — DEXAMETHASONE 4 MG PO TABS
4.00 | ORAL_TABLET | ORAL | Status: DC
Start: 2018-04-30 — End: 2018-04-29

## 2018-04-29 MED ORDER — HYDROMORPHONE HCL-NACL 10-0.9 MG/50ML-% IV SOSY
PREFILLED_SYRINGE | INTRAVENOUS | Status: DC
Start: ? — End: 2018-04-29

## 2018-04-29 MED ORDER — GENERIC EXTERNAL MEDICATION
8.60 | Status: DC
Start: ? — End: 2018-04-29

## 2018-04-29 MED ORDER — LIDOCAINE 4 % EX PTCH
1.00 | MEDICATED_PATCH | CUTANEOUS | Status: DC
Start: 2018-04-29 — End: 2018-04-29

## 2018-04-29 MED ORDER — LACTATED RINGERS IV SOLN
INTRAVENOUS | Status: DC
Start: ? — End: 2018-04-29

## 2018-04-29 MED ORDER — POLYETHYLENE GLYCOL 3350 17 G PO PACK
17.00 | PACK | ORAL | Status: DC
Start: ? — End: 2018-04-29

## 2018-04-29 MED ORDER — GENERIC EXTERNAL MEDICATION
0.10 | Status: DC
Start: ? — End: 2018-04-29

## 2018-04-29 MED ORDER — FENTANYL 25 MCG/HR TD PT72
1.00 | MEDICATED_PATCH | TRANSDERMAL | Status: DC
Start: 2018-04-30 — End: 2018-04-29

## 2018-04-29 MED ORDER — METHADONE HCL 5 MG/5ML PO SOLN
2.50 | ORAL | Status: DC
Start: 2018-04-29 — End: 2018-04-29

## 2018-04-29 MED ORDER — GENERIC EXTERNAL MEDICATION
1250.00 | Status: DC
Start: 2018-04-29 — End: 2018-04-29

## 2018-05-24 DEATH — deceased

## 2019-03-31 IMAGING — CT CT HEAD W/O CM
3 series · 16 of 47 positions shown, 19 images · non-contrast
Comparison: None.

CLINICAL DATA: Altered level of consciousness. Weakness,
diaphoresis, and vomiting. Recent chemotherapy.

EXAM:
CT HEAD WITHOUT CONTRAST
TECHNIQUE: Contiguous axial images were obtained from the base of the skull
through the vertex without intravenous contrast.

[Series 2: head trauma wo · axial · 0.42mm/px · z∈[+1752,+1882]mm · 10 of 32 slices shown, 13 images]
[im 3/32  brain]
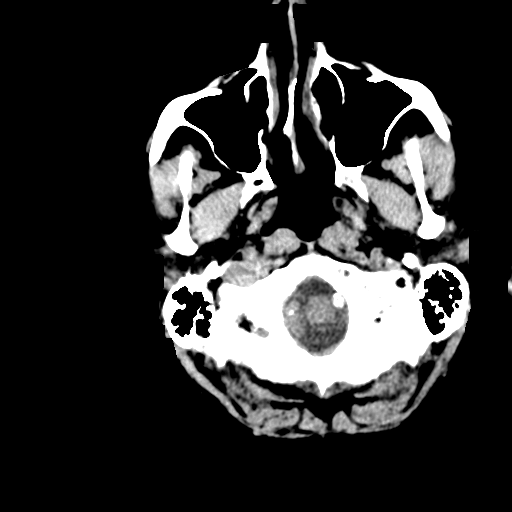
[im 3/32  bone]
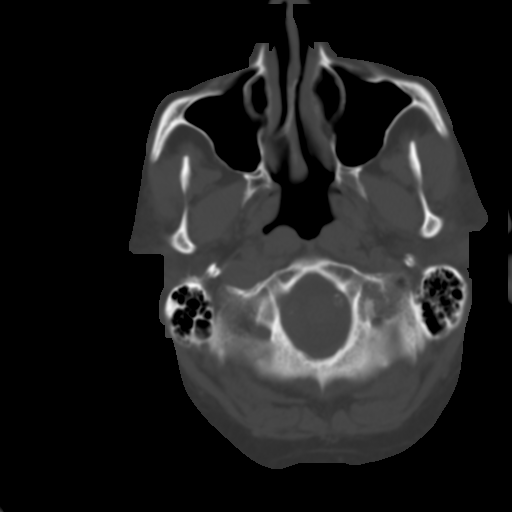
[im 6/32  brain]
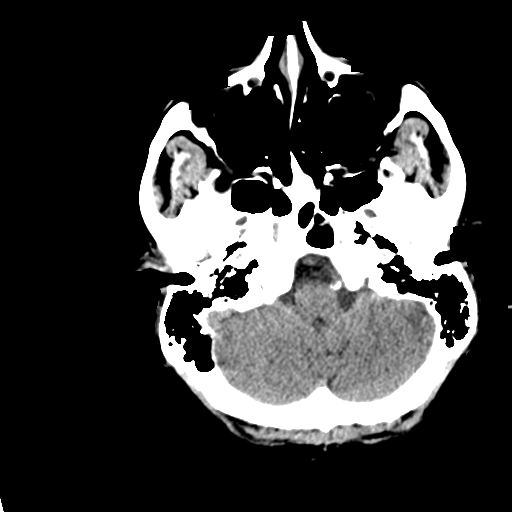
[im 9/32  brain]
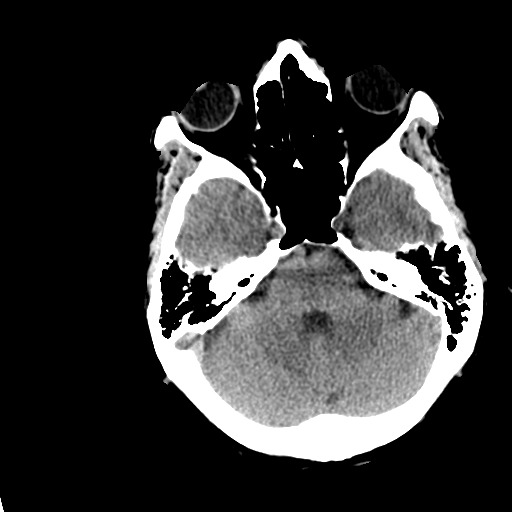
[im 11/32  brain]
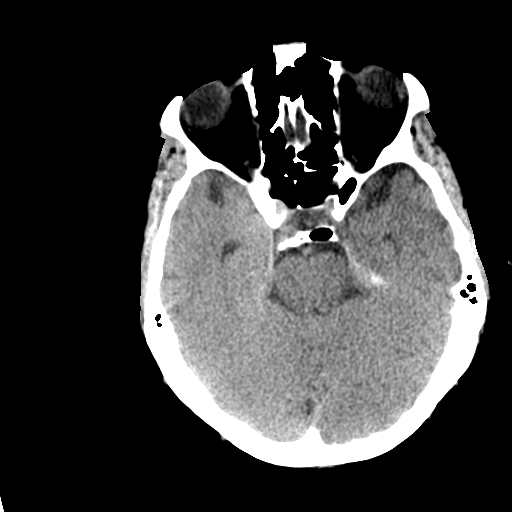
[im 14/32  brain]
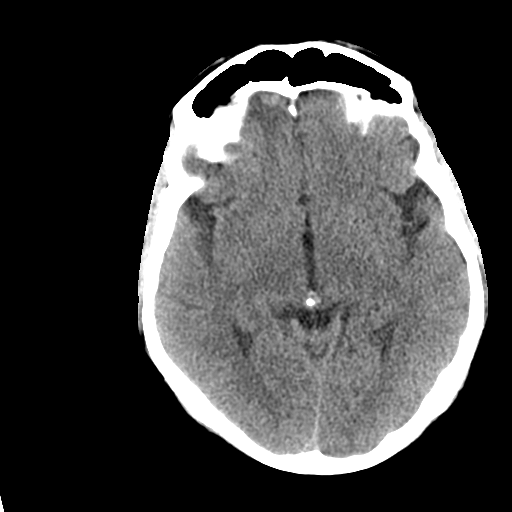
[im 14/32  bone]
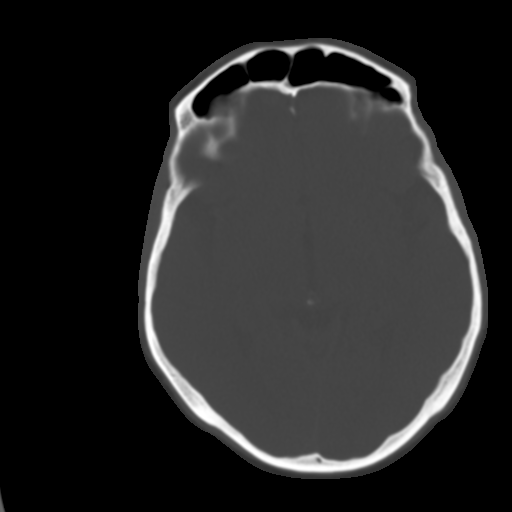
[im 18/32  brain]
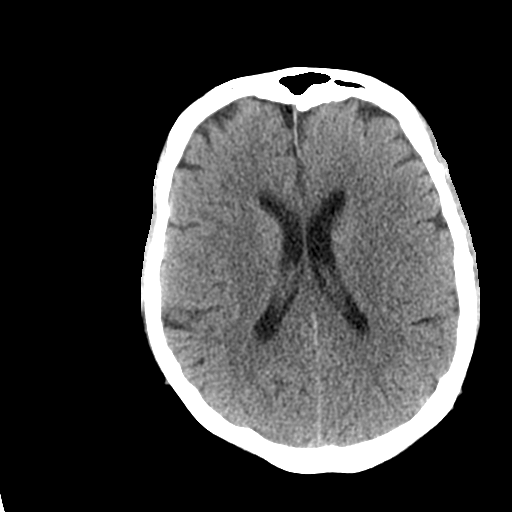
[im 21/32  brain]
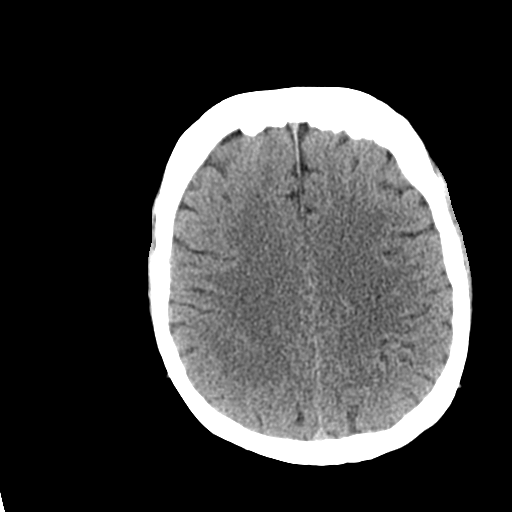
[im 24/32  brain]
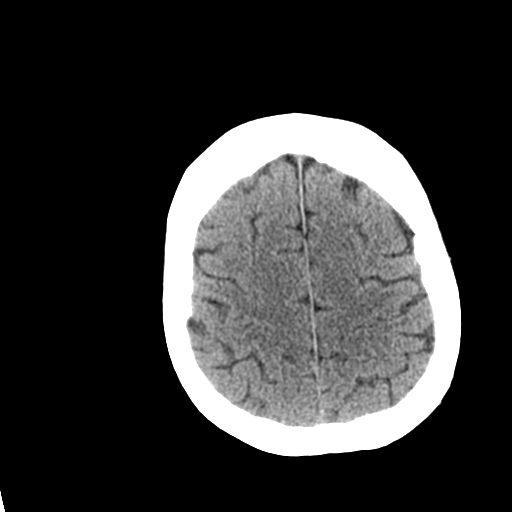
[im 26/32  brain]
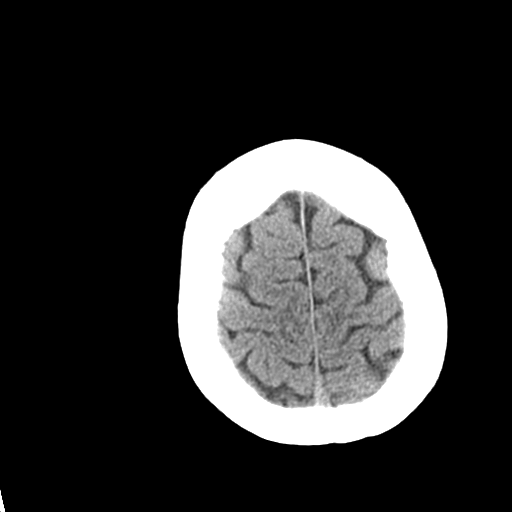
[im 26/32  bone]
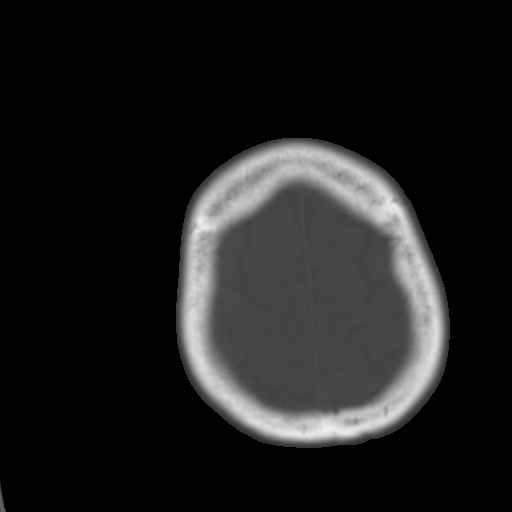
[im 29/32  brain]
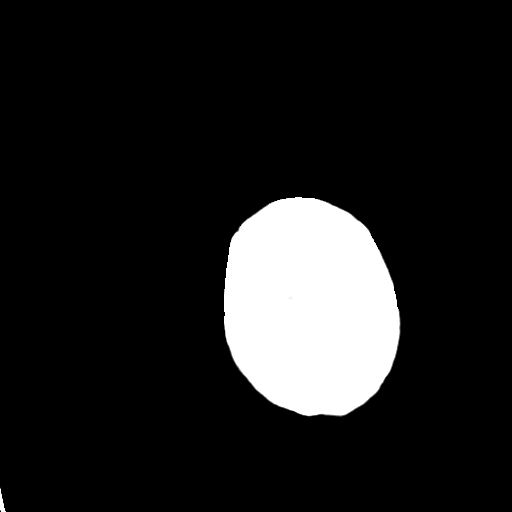

[Series 4: coronal soft tissue · coronal · 0.31mm/px · 3 of 68 slices shown]
[im 23/68  brain]
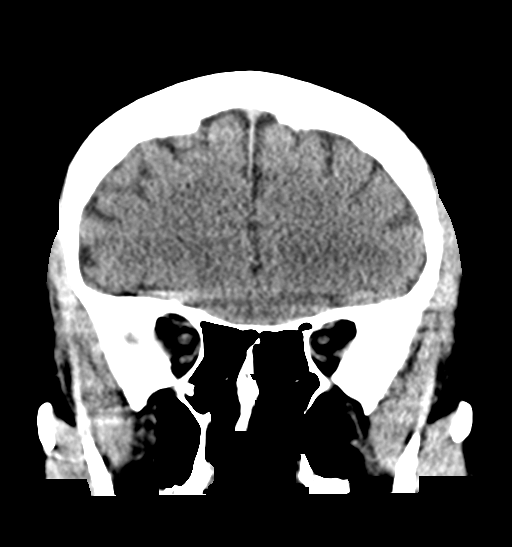
[im 30/68  brain]
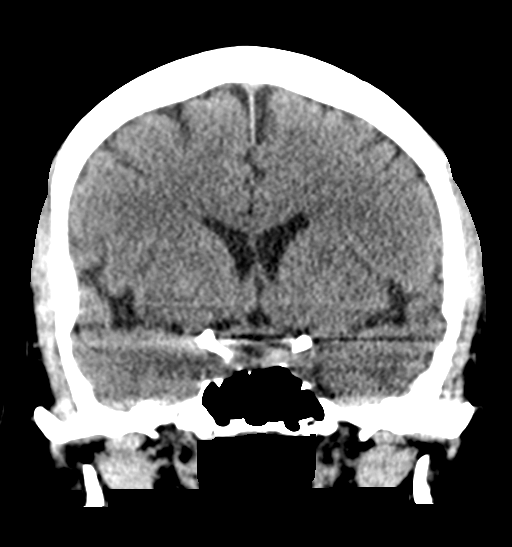
[im 38/68  brain]
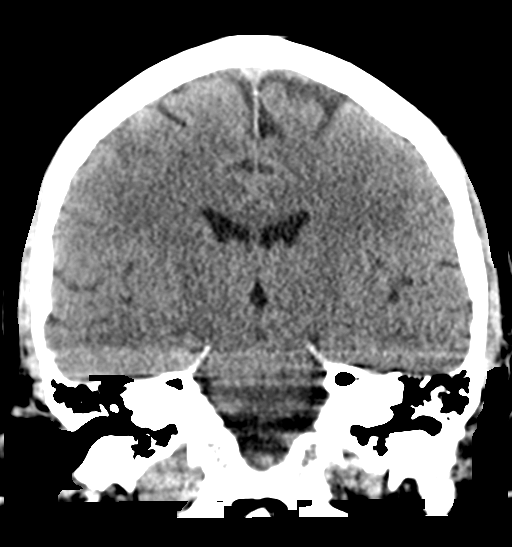

[Series 5: sagittal soft tissue · sagittal · 0.35mm/px · 3 of 55 slices shown]
[im 19/55  brain]
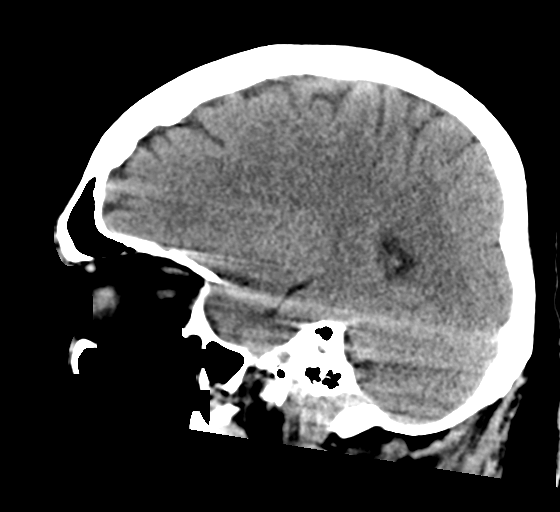
[im 28/55  brain]
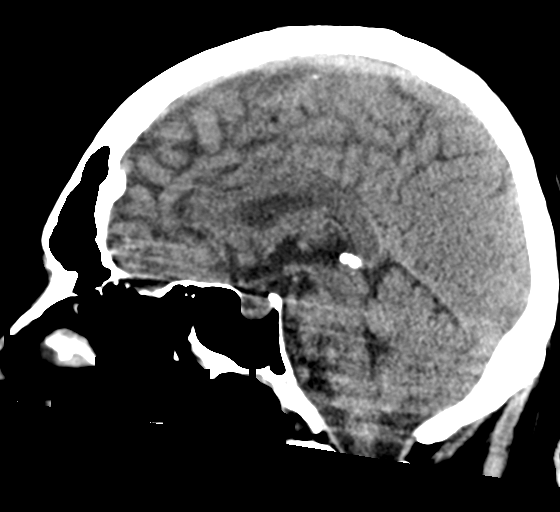
[im 37/55  brain]
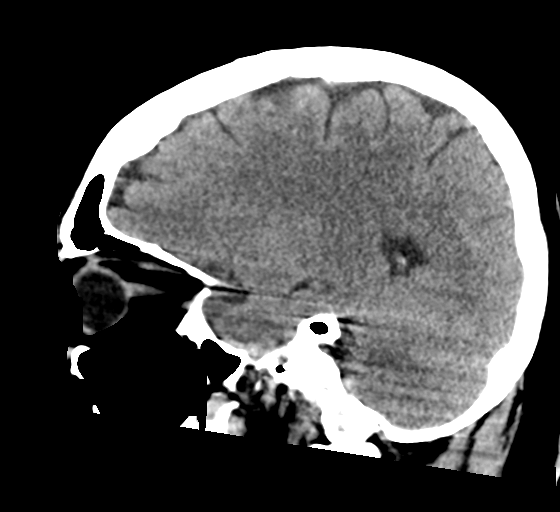

[16 of 47 positions shown; findings below may reference images not displayed]

FINDINGS: Brain: There is no evidence of acute infarct, intracranial
hemorrhage, mass, midline shift, or extra-axial fluid collection.
The ventricles and sulci are normal.

Vascular: Calcified atherosclerosis at the skull base. No hyperdense
vessel.

Skull: No fracture or focal osseous lesion.

Sinuses/Orbits: Visualized paranasal sinuses and mastoid air cells
are clear. Left cataract extraction is noted.

Other: None.
IMPRESSION: Unremarkable CT appearance of the brain.

## 2023-04-15 ENCOUNTER — Encounter (HOSPITAL_COMMUNITY): Payer: Self-pay | Admitting: Emergency Medicine
